# Patient Record
Sex: Female | Born: 1945 | Race: White | Hispanic: No | Marital: Married | Smoking: Never smoker
Health system: Southern US, Community
[De-identification: ages and names within clinical notes are randomized; demographics above are authoritative.]

## PROBLEM LIST (undated history)

## (undated) DIAGNOSIS — M1990 Unspecified osteoarthritis, unspecified site: Secondary | ICD-10-CM

## (undated) DIAGNOSIS — H269 Unspecified cataract: Secondary | ICD-10-CM

## (undated) DIAGNOSIS — I10 Essential (primary) hypertension: Secondary | ICD-10-CM

## (undated) HISTORY — PX: JOINT REPLACEMENT: SHX530

## (undated) HISTORY — DX: Unspecified cataract: H26.9

## (undated) HISTORY — PX: CATARACT EXTRACTION, BILATERAL: SHX1313

## (undated) HISTORY — PX: OTHER SURGICAL HISTORY: SHX169

## (undated) HISTORY — DX: Unspecified osteoarthritis, unspecified site: M19.90

## (undated) HISTORY — DX: Essential (primary) hypertension: I10

## (undated) HISTORY — PX: BREAST CYST EXCISION: SHX579

---

## 1978-04-05 HISTORY — PX: NASAL SINUS SURGERY: SHX719

## 2004-04-05 HISTORY — PX: COLONOSCOPY: SHX174

## 2006-01-18 ENCOUNTER — Ambulatory Visit: Payer: Self-pay | Admitting: Internal Medicine

## 2006-05-12 ENCOUNTER — Ambulatory Visit: Payer: Self-pay

## 2007-01-24 ENCOUNTER — Ambulatory Visit: Payer: Self-pay | Admitting: Internal Medicine

## 2008-01-17 ENCOUNTER — Ambulatory Visit: Payer: Self-pay | Admitting: Internal Medicine

## 2008-01-25 ENCOUNTER — Ambulatory Visit: Payer: Self-pay | Admitting: Internal Medicine

## 2008-04-08 ENCOUNTER — Emergency Department: Payer: Self-pay | Admitting: Emergency Medicine

## 2009-01-28 ENCOUNTER — Ambulatory Visit: Payer: Self-pay | Admitting: Internal Medicine

## 2010-01-20 ENCOUNTER — Ambulatory Visit: Payer: Self-pay | Admitting: Internal Medicine

## 2010-02-03 ENCOUNTER — Ambulatory Visit: Payer: Self-pay | Admitting: Internal Medicine

## 2011-02-11 ENCOUNTER — Ambulatory Visit: Payer: Self-pay | Admitting: Internal Medicine

## 2011-06-11 ENCOUNTER — Ambulatory Visit: Payer: Self-pay | Admitting: Internal Medicine

## 2012-04-20 ENCOUNTER — Ambulatory Visit: Payer: Self-pay | Admitting: Internal Medicine

## 2012-07-25 ENCOUNTER — Ambulatory Visit: Payer: Self-pay | Admitting: Internal Medicine

## 2012-12-02 ENCOUNTER — Emergency Department: Payer: Self-pay | Admitting: Emergency Medicine

## 2012-12-18 ENCOUNTER — Ambulatory Visit: Payer: Self-pay | Admitting: Internal Medicine

## 2013-07-17 ENCOUNTER — Ambulatory Visit: Payer: Self-pay | Admitting: Internal Medicine

## 2013-08-09 ENCOUNTER — Encounter: Payer: Self-pay | Admitting: *Deleted

## 2013-08-14 ENCOUNTER — Ambulatory Visit (INDEPENDENT_AMBULATORY_CARE_PROVIDER_SITE_OTHER): Payer: Medicare PPO | Admitting: General Surgery

## 2013-08-14 ENCOUNTER — Encounter: Payer: Self-pay | Admitting: General Surgery

## 2013-08-14 VITALS — BP 170/80 | HR 88 | Resp 14 | Ht 64.0 in | Wt 194.0 lb

## 2013-08-14 DIAGNOSIS — L723 Sebaceous cyst: Secondary | ICD-10-CM

## 2013-08-14 DIAGNOSIS — L089 Local infection of the skin and subcutaneous tissue, unspecified: Secondary | ICD-10-CM

## 2013-08-14 DIAGNOSIS — L729 Follicular cyst of the skin and subcutaneous tissue, unspecified: Principal | ICD-10-CM

## 2013-08-14 NOTE — Progress Notes (Signed)
Patient ID: Jordan Salazar, female   DOB: [birth year], 68 y.o.   MRN: [id number]  Chief Complaint  Patient presents with  . Other    breast cyst    HPI Jordan Salazar is a 68 y.o. female. here today for an evalution of a right breast cyst . In March, pt noted a firm round, small nodule on Right breast prior to mammogram in 07/15/13. Pt was given 10 days of Keflex by PCP. She was told she will have referral to surgeon if cyst became worse. PT states it was pink and has gotten progressively darker.   Patient does perform self breast check and gets regualr mammogram. .HPI  Past Medical History  Diagnosis Date  . Hypertension     Past Surgical History  Procedure Laterality Date  . Nasal sinus surgery  1980    Family History  Problem Relation Age of Onset  . Breast cancer Mother     9660  . Breast cancer Sister     1140    Social History History  Substance Use Topics  . Smoking status: Never Smoker   . Smokeless tobacco: Never Used  . Alcohol Use: Yes    No Known Allergies  Current Outpatient Prescriptions  Medication Sig Dispense Refill  . aspirin 81 MG tablet Take 81 mg by mouth daily.      . Biotin 5000 MCG CAPS Take 1 capsule by mouth daily.      . cholecalciferol (VITAMIN D) 400 UNITS TABS tablet Take 400 Units by mouth.      . Cinnamon 500 MG TABS Take 1 tablet by mouth daily.      . Garlic 1000 MG CAPS Take 1 capsule by mouth daily.      . magnesium 30 MG tablet Take 400 mg by mouth 2 (two) times daily.      . Multiple Vitamins-Minerals (MULTIVITAMIN WITH MINERALS) tablet Take 1 tablet by mouth daily.      . Omega-3 Fatty Acids (FISH OIL) 1200 MG CPDR Take 1 capsule by mouth.      . ramipril (ALTACE) 5 MG capsule Take 1 capsule by mouth daily.       No current facility-administered medications for this visit.    Review of Systems Review of Systems  Constitutional: Negative.   Respiratory: Negative.   Cardiovascular: Negative.     Blood pressure 170/80,  pulse 88, resp. rate 14, height 5\' 4"  (1.626 m), weight 194 lb (87.998 kg).  Physical Exam Physical Exam  Constitutional: She is oriented to person, place, and time. She appears well-developed and well-nourished.  Eyes: Conjunctivae are normal.  Neck: Neck supple.  Pulmonary/Chest: Right breast exhibits tenderness. Right breast exhibits no inverted nipple, no nipple discharge and no skin change. Left breast exhibits no inverted nipple, no mass, no nipple discharge, no skin change and no tenderness.  There is a 3 by 3 cm red tender mass on the superior medial aspect of the right breast. Small opening at the inferior not currently draining.   Neurological: She is alert and oriented to person, place, and time.  Skin: Skin is dry.    Data Reviewed Mammogram reviewed   Assessment    Infected skin cyst in right breast      Plan    Incision and Drainage performed with patient consent.   Area prepped with betadine 1ml 1% xylocaine instilled.  1 cm incision over central part. Pus and cyst contents was evacuated. Gauze dressing with tape. No immediate  problems with procedure.   Recheck 1 month for residual cyst.         Seeplaputhur G Sankar 08/14/2013, 2:27 PM

## 2013-08-14 NOTE — Patient Instructions (Signed)
Pt to return in 1 month.

## 2013-09-17 ENCOUNTER — Ambulatory Visit (INDEPENDENT_AMBULATORY_CARE_PROVIDER_SITE_OTHER): Payer: Medicare PPO | Admitting: General Surgery

## 2013-09-17 ENCOUNTER — Encounter: Payer: Self-pay | Admitting: General Surgery

## 2013-09-17 VITALS — BP 122/68 | HR 72 | Resp 14 | Ht 68.0 in | Wt 195.0 lb

## 2013-09-17 DIAGNOSIS — L723 Sebaceous cyst: Secondary | ICD-10-CM

## 2013-09-17 DIAGNOSIS — Z803 Family history of malignant neoplasm of breast: Secondary | ICD-10-CM

## 2013-09-17 DIAGNOSIS — L089 Local infection of the skin and subcutaneous tissue, unspecified: Secondary | ICD-10-CM

## 2013-09-17 DIAGNOSIS — L729 Follicular cyst of the skin and subcutaneous tissue, unspecified: Principal | ICD-10-CM

## 2013-09-17 NOTE — Patient Instructions (Addendum)
Patient to return as needed. Continue self breast exams. Call office for any new breast issues or concerns. Patient to consider genetic testing.

## 2013-09-17 NOTE — Progress Notes (Signed)
Patient ID: Jordan Salazar, female   DOB: [birth year], 68 y.o.   MRN: [id number]  Chief Complaint  Patient presents with  . Follow-up    1 month right breast cyst    HPI Jordan Cateraula Smith Balling is a 68 y.o. female who presents for a 1 month follow up right breast abscess that was drained. The patient denies any new problems with the breasts. She states the cyst has decreased in size since her last visit.   HPI  Past Medical History  Diagnosis Date  . Hypertension     Past Surgical History  Procedure Laterality Date  . Nasal sinus surgery  1980    Family History  Problem Relation Age of Onset  . Breast cancer Mother     6860  . Breast cancer Sister     1640    Social History History  Substance Use Topics  . Smoking status: Never Smoker   . Smokeless tobacco: Never Used  . Alcohol Use: Yes    No Known Allergies  Current Outpatient Prescriptions  Medication Sig Dispense Refill  . aspirin 81 MG tablet Take 81 mg by mouth daily.      . Biotin 5000 MCG CAPS Take 1 capsule by mouth daily.      . cholecalciferol (VITAMIN D) 400 UNITS TABS tablet Take 400 Units by mouth.      . Cinnamon 500 MG TABS Take 1 tablet by mouth daily.      . Garlic 1000 MG CAPS Take 1 capsule by mouth daily.      . magnesium 30 MG tablet Take 400 mg by mouth 2 (two) times daily.      . Multiple Vitamins-Minerals (MULTIVITAMIN WITH MINERALS) tablet Take 1 tablet by mouth daily.      . Omega-3 Fatty Acids (FISH OIL) 1200 MG CPDR Take 1 capsule by mouth.      [name]. OVER THE COUNTER MEDICATION Take 1 tablet by mouth daily. Tumeric      . ramipril (ALTACE) 5 MG capsule Take 1 capsule by mouth daily.       No current facility-administered medications for this visit.    Review of Systems Review of Systems  Constitutional: Negative.   Respiratory: Negative.   Cardiovascular: Negative.     Blood pressure 122/68, pulse 72, resp. rate 14, height 5\' 8"  (1.727 m), weight 195 lb (88.451 kg).  Physical  Exam Physical Exam  Constitutional: She is oriented to person, place, and time. She appears well-developed and well-nourished.  Pulmonary/Chest: Right breast exhibits no inverted nipple, no mass, no nipple discharge, no skin change and no tenderness.  Neurological: She is alert and oriented to person, place, and time.  Skin: Skin is warm and dry.  Drainage site medial right breast near sternal border is completely healed. No palpable residual cyst.   Data Reviewed  None  Assessment    Cutaneous cyst right breast medial. Appears to have resolved.  Patient is at high risk for breast cancer with mother and sister with breast cancer.  Genetic testing was discussed with patient. Patient to think about if she would like to pursue this.   Plan    Patient to return as needed. Patient advised to continue self breast checks monthly. She is also advised to call with any new questions or concerns.        Zhoe Catania G 09/17/2013, 8:26 PM

## 2014-02-04 ENCOUNTER — Encounter: Payer: Self-pay | Admitting: General Surgery

## 2014-02-18 ENCOUNTER — Ambulatory Visit: Payer: Self-pay | Admitting: Internal Medicine

## 2014-02-20 ENCOUNTER — Encounter: Payer: Self-pay | Admitting: *Deleted

## 2014-03-04 ENCOUNTER — Encounter: Payer: Self-pay | Admitting: General Surgery

## 2014-03-04 ENCOUNTER — Ambulatory Visit (INDEPENDENT_AMBULATORY_CARE_PROVIDER_SITE_OTHER): Payer: Medicare PPO | Admitting: General Surgery

## 2014-03-04 VITALS — BP 122/76 | HR 80 | Resp 12 | Ht 68.0 in | Wt 194.0 lb

## 2014-03-04 DIAGNOSIS — Z803 Family history of malignant neoplasm of breast: Secondary | ICD-10-CM

## 2014-03-04 NOTE — Progress Notes (Signed)
Patient ID: Colman Cateraula Smith Crotteau, female   DOB: Dec 01, 1945, 68 y.o.   MRN: [id number]  Chief Complaint  Patient presents with  . Other    colonoscopy    HPI Colman Cateraula Smith Durbin is a 68 y.o. female who presents for an evaluation of a screening colonoscopy. Her last colonoscopy was done in 2006. She denies any GI issues at this time. Reports no polyps found in previous colonoscopy. Patient's last mammogram in April  HPI  Past Medical History  Diagnosis Date  . Hypertension     Past Surgical History  Procedure Laterality Date  . Nasal sinus surgery  1980  . Colonoscopy  2006    [place] in [place]  . Uterian ablation      Family History  Problem Relation Age of Onset  . Breast cancer Mother     7060  . Breast cancer Sister     2840    Social History History  Substance Use Topics  . Smoking status: Never Smoker   . Smokeless tobacco: Never Used  . Alcohol Use: 0.0 oz/week    0 Not specified per week    No Known Allergies  Current Outpatient Prescriptions  Medication Sig Dispense Refill  . aspirin 81 MG tablet Take 81 mg by mouth daily.    . cholecalciferol (VITAMIN D) 400 UNITS TABS tablet Take 400 Units by mouth.    . Garlic 1000 MG CAPS Take 1 capsule by mouth daily.    . magnesium 30 MG tablet Take 400 mg by mouth 2 (two) times daily.    . Multiple Vitamins-Minerals (MULTIVITAMIN WITH MINERALS) tablet Take 1 tablet by mouth daily.    . Omega-3 Fatty Acids (FISH OIL) 1200 MG CPDR Take 1 capsule by mouth.    [name]. OVER THE COUNTER MEDICATION Take 1 tablet by mouth daily. Tumeric    . ramipril (ALTACE) 5 MG capsule Take 1 capsule by mouth daily.     No current facility-administered medications for this visit.    Review of Systems Review of Systems  Constitutional: Negative.   Respiratory: Negative.   Cardiovascular: Negative.   Gastrointestinal: Negative.     Blood pressure 122/76, pulse 80, resp. rate 12, height 5\' 8"  (1.727 m), weight 194 lb (87.998 kg).  Physical  Exam Physical Exam Not done today  Data Reviewed Prior notes.  Assessment    Pt will not need colonoscopy till next yr. As noted in her last note from June 2015 pt is high risk for breast Ca and qualifies for genetic testiong. She has not decided on this yet. Once again this was discussed in full with pt. She is due for her annual mammogram in March. Will arrange that and see her back. Colonoscopy can be scheduled at that visit. In the interim have urged her to consider genetic testing.    Plan    Bil screening mammogram and OV  In Mar 2016      Cc: Dr. Dewaine Oatsenny Tate, MD  Kieth BrightlySANKAR,Merlene Dante G 03/04/2014, 11:48 AM

## 2014-03-04 NOTE — Patient Instructions (Signed)
Patient to return in March 2016 with bilateral screening mammogram. Continue self breast exams. Call office for any new breast issues or concerns.

## 2014-03-07 ENCOUNTER — Encounter: Payer: Self-pay | Admitting: *Deleted

## 2014-03-07 NOTE — Progress Notes (Signed)
Pre certification forms completed for BRCA testing and faxed to [facility].

## 2014-03-12 ENCOUNTER — Telehealth: Payer: Self-pay | Admitting: *Deleted

## 2014-03-12 NOTE — Telephone Encounter (Signed)
[employer] denied BRCA they were under the impression that the testing had already been completed. I called and talked with JD to let them know testing had not been completed notes resent. Awaiting call from [facility].

## 2014-03-20 ENCOUNTER — Telehealth: Payer: Self-pay | Admitting: *Deleted

## 2014-03-20 ENCOUNTER — Encounter: Payer: Self-pay | Admitting: *Deleted

## 2014-03-20 NOTE — Telephone Encounter (Signed)
Authorization is needed for BRCA. Office notes faxed to [phone] case # [id number] for review to obtain authorization.

## 2014-03-20 NOTE — Telephone Encounter (Signed)
I talked with the patient and she is aware that we are awaiting [employer] authorization for BRCA genetic testing. It was initially denied because they thought the testing had been done, it has not. An appeal letter has been sent to them. She will contact [employer] to see if authorization is needed for them as well and let me know.

## 2014-04-03 ENCOUNTER — Ambulatory Visit (INDEPENDENT_AMBULATORY_CARE_PROVIDER_SITE_OTHER): Payer: Self-pay | Admitting: *Deleted

## 2014-04-03 DIAGNOSIS — Z803 Family history of malignant neoplasm of breast: Secondary | ICD-10-CM

## 2014-04-03 NOTE — Progress Notes (Signed)
BRCA completed, pt aware insurance pending and that she will get a call from [facility] if over $375 for her to make the final decision to proceed with testing.

## 2014-04-03 NOTE — Patient Instructions (Signed)
The patient is aware to call back for any questions or concerns.  

## 2014-04-24 ENCOUNTER — Encounter: Payer: Self-pay | Admitting: *Deleted

## 2014-04-24 NOTE — Progress Notes (Signed)
BRCA test cancelled, insurance denial.

## 2014-04-24 NOTE — Patient Instructions (Signed)
none

## 2014-05-02 ENCOUNTER — Telehealth: Payer: Self-pay | Admitting: *Deleted

## 2014-05-02 NOTE — Telephone Encounter (Signed)
Aware authorization received from [facility] and faxed to [facility]. Awaiting next step from [facility].

## 2014-07-22 ENCOUNTER — Encounter: Payer: Self-pay | Admitting: General Surgery

## 2014-07-22 ENCOUNTER — Ambulatory Visit
Admit: 2014-07-22 | Disposition: A | Discharge status: Home / Self Care | Payer: Self-pay | Attending: General Surgery | Admitting: General Surgery

## 2014-08-07 ENCOUNTER — Encounter: Payer: Self-pay | Admitting: General Surgery

## 2014-08-07 ENCOUNTER — Ambulatory Visit (INDEPENDENT_AMBULATORY_CARE_PROVIDER_SITE_OTHER): Payer: Medicare PPO | Admitting: General Surgery

## 2014-08-07 VITALS — BP 136/68 | HR 84 | Resp 18 | Ht 68.0 in | Wt 194.0 lb

## 2014-08-07 DIAGNOSIS — Z1211 Encounter for screening for malignant neoplasm of colon: Secondary | ICD-10-CM | POA: Diagnosis not present

## 2014-08-07 DIAGNOSIS — Z803 Family history of malignant neoplasm of breast: Secondary | ICD-10-CM

## 2014-08-07 MED ORDER — POLYETHYLENE GLYCOL 3350 17 GM/SCOOP PO POWD: ORAL | Status: DC

## 2014-08-07 NOTE — Patient Instructions (Addendum)
Continue self breast exams. Call office for any new breast issues or concerns.  Colonoscopy A colonoscopy is an exam to look at the entire large intestine (colon). This exam can help find problems such as tumors, polyps, inflammation, and areas of bleeding. The exam takes about 1 hour.  LET [facility] PROVIDER KNOW ABOUT:   Any allergies you have.  All medicines you are taking, including vitamins, herbs, eye drops, creams, and over-the-counter medicines.  Previous problems you or members of your family have had with the use of anesthetics.  Any blood disorders you have.  Previous surgeries you have had.  Medical conditions you have. RISKS AND COMPLICATIONS  Generally, this is a safe procedure. However, as with any procedure, complications can occur. Possible complications include:  Bleeding.  Tearing or rupture of the colon wall.  Reaction to medicines given during the exam.  Infection (rare). BEFORE THE PROCEDURE   Ask your health care provider about changing or stopping your regular medicines.  You may be prescribed an oral bowel prep. This involves drinking a large amount of medicated liquid, starting the day before your procedure. The liquid will cause you to have multiple loose stools until your stool is almost clear or light green. This cleans out your colon in preparation for the procedure.  Do not eat or drink anything else once you have started the bowel prep, unless your health care provider tells you it is safe to do so.  Arrange for someone to drive you home after the procedure. PROCEDURE   You will be given medicine to help you relax (sedative).  You will lie on your side with your knees bent.  A long, flexible tube with a light and camera on the end (colonoscope) will be inserted through the rectum and into the colon. The camera sends video back to a computer screen as it moves through the colon. The colonoscope also releases carbon dioxide gas to inflate  the colon. This helps your health care provider see the area better.  During the exam, your health care provider may take a small tissue sample (biopsy) to be examined under a microscope if any abnormalities are found.  The exam is finished when the entire colon has been viewed. AFTER THE PROCEDURE   Do not drive for 24 hours after the exam.  You may have a small amount of blood in your stool.  You may pass moderate amounts of gas and have mild abdominal cramping or bloating. This is caused by the gas used to inflate your colon during the exam.  Ask when your test results will be ready and how you will get your results. Make sure you get your test results. Document Released: 03/19/2000 Document Revised: 01/10/2013 Document Reviewed: 11/27/2012 [facility] [employer] Patient Information 2015 [employer] [place]. This information is not intended to replace advice given to you by your health care provider. Make sure you discuss any questions you have with your health care provider.  Patient has been scheduled [facility]. This patient has been asked to discontinue fish oil one week prior to procedure.

## 2014-08-07 NOTE — Progress Notes (Signed)
Patient ID: Jordan Salazar, female   DOB: 06/26/1945, 68 y.o.   MRN: [id number]  Chief Complaint  Patient presents with  . Follow-up    mammogram    HPI Jordan Salazar is a 68 y.o. female.  who presents for her follow up mammogram and breast evaluation. The most recent mammogram was done on 07-22-14.  Patient does perform regular self breast checks and gets regular mammograms done.  No new breast issues.  HPI  Past Medical History  Diagnosis Date  . Hypertension     Past Surgical History  Procedure Laterality Date  . Nasal sinus surgery  1980  . Colonoscopy  2006    Done in [place]  . Uterian ablation      Family History  Problem Relation Age of Onset  . Breast cancer Mother     60  . Breast cancer Sister     40    Social History History  Substance Use Topics  . Smoking status: Never Smoker   . Smokeless tobacco: Never Used  . Alcohol Use: 0.0 oz/week    0 Standard drinks or equivalent per week    No Known Allergies  Current Outpatient Prescriptions  Medication Sig Dispense Refill  . aspirin 81 MG tablet Take 81 mg by mouth daily.    . cholecalciferol (VITAMIN D) 400 UNITS TABS tablet Take 400 Units by mouth.    . Garlic 1000 MG CAPS Take 1 capsule by mouth daily.    . magnesium 30 MG tablet Take 400 mg by mouth 2 (two) times daily.    . Multiple Vitamins-Minerals (MULTIVITAMIN WITH MINERALS) tablet Take 1 tablet by mouth daily.    . Omega-3 Fatty Acids (FISH OIL) 1200 MG CPDR Take 1 capsule by mouth.    . OVER THE COUNTER MEDICATION Take 1 tablet by mouth daily. Tumeric    . ramipril (ALTACE) 5 MG capsule Take 1 capsule by mouth daily.     No current facility-administered medications for this visit.    Review of Systems Review of Systems  Constitutional: Negative.   Respiratory: Negative.   Cardiovascular: Negative.     Blood pressure 136/68, pulse 84, resp. rate 18, height 5' 8" (1.727 m), weight 194 lb (87.998 kg).  Physical Exam Physical  Exam  Constitutional: She is oriented to person, place, and time. She appears well-developed and well-nourished.  Eyes: Conjunctivae are normal. No scleral icterus.  Neck: Neck supple.  Cardiovascular: Normal rate, regular rhythm and normal heart sounds.   Pulmonary/Chest: Effort normal and breath sounds normal. Right breast exhibits no inverted nipple, no mass, no nipple discharge, no skin change and no tenderness. Left breast exhibits no inverted nipple, no mass, no nipple discharge, no skin change and no tenderness.  Abdominal: Soft. Normal appearance. There is no tenderness. A hernia is present.  Small umbilical hernia.  Lymphadenopathy:    She has no cervical adenopathy.    She has no axillary adenopathy.  Neurological: She is alert and oriented to person, place, and time.  Skin: Skin is warm and dry.    Data Reviewed Mammogram reviewed and stable.  Assessment    Stable physical exam. FH of colon cancer.     Plan    The patient has been asked to return to the office in one year with a bilateral screening mammogram. Continue self breast exams. Call office for any new breast issues or concerns.  Pt is due for screening colonoscopy. Colonoscopy with possible biopsy/polypectomy prn: Information regarding   the procedure, including its potential risks and complications (including but not limited to perforation of the bowel, which may require emergency surgery to repair, and bleeding) was verbally given to the patient. Educational information regarding lower instestinal endoscopy was given to the patient. Written instructions for how to complete the bowel prep using Miralax were provided. The importance of drinking ample fluids to avoid dehydration as a result of the prep emphasized.       PCP:  Jaquita Rectorate, Denny C  Jordayn Mink G 08/07/2014, 9:32 AM

## 2014-08-14 ENCOUNTER — Ambulatory Visit
Admission: RE | Admit: 2014-08-14 | Discharge: 2014-08-14 | Disposition: A | Discharge status: Home / Self Care | Payer: Medicare PPO | Source: Ambulatory Visit | Attending: General Surgery | Admitting: General Surgery

## 2014-08-14 ENCOUNTER — Ambulatory Visit: Payer: Medicare PPO | Admitting: Anesthesiology

## 2014-08-14 ENCOUNTER — Encounter
Admission: RE | Disposition: A | Discharge status: Home / Self Care | Payer: Self-pay | Source: Ambulatory Visit | Attending: General Surgery

## 2014-08-14 ENCOUNTER — Encounter: Payer: Self-pay | Admitting: General Surgery

## 2014-08-14 DIAGNOSIS — I10 Essential (primary) hypertension: Secondary | ICD-10-CM | POA: Diagnosis not present

## 2014-08-14 DIAGNOSIS — Z7982 Long term (current) use of aspirin: Secondary | ICD-10-CM | POA: Insufficient documentation

## 2014-08-14 DIAGNOSIS — Z79899 Other long term (current) drug therapy: Secondary | ICD-10-CM | POA: Diagnosis not present

## 2014-08-14 DIAGNOSIS — Z1211 Encounter for screening for malignant neoplasm of colon: Secondary | ICD-10-CM | POA: Diagnosis present

## 2014-08-14 DIAGNOSIS — Z9889 Other specified postprocedural states: Secondary | ICD-10-CM | POA: Insufficient documentation

## 2014-08-14 DIAGNOSIS — Z800 Family history of malignant neoplasm of digestive organs: Secondary | ICD-10-CM | POA: Insufficient documentation

## 2014-08-14 DIAGNOSIS — Z803 Family history of malignant neoplasm of breast: Secondary | ICD-10-CM | POA: Diagnosis not present

## 2014-08-14 HISTORY — PX: COLONOSCOPY: SHX5424

## 2014-08-14 SURGERY — COLONOSCOPY
Anesthesia: General

## 2014-08-14 MED ORDER — LACTATED RINGERS IV SOLN
INTRAVENOUS | Status: DC
  Administered 2014-08-14: 1000 mL via INTRAVENOUS

## 2014-08-14 MED ORDER — LIDOCAINE HCL (CARDIAC) 20 MG/ML IV SOLN
INTRAVENOUS | Status: DC | PRN
  Administered 2014-08-14: 60 mg via INTRAVENOUS

## 2014-08-14 MED ORDER — PROPOFOL 10 MG/ML IV BOLUS
INTRAVENOUS | Status: DC | PRN
  Administered 2014-08-14: 50 mg via INTRAVENOUS

## 2014-08-14 MED ORDER — PROPOFOL INFUSION 10 MG/ML OPTIME
INTRAVENOUS | Status: DC | PRN
  Administered 2014-08-14: 250 ug/kg/min via INTRAVENOUS

## 2014-08-14 NOTE — Anesthesia Postprocedure Evaluation (Signed)
  Anesthesia Post-op Note  Patient: Jordan Salazar  Procedure(s) Performed: Procedure(s): COLONOSCOPY (N/A)  Anesthesia type:General  Patient location: PACU  Post pain: Pain level controlled  Post assessment: Post-op Vital signs reviewed, Patient's Cardiovascular Status Stable, Respiratory Function Stable, Patent Airway and No signs of Nausea or vomiting  Post vital signs: Reviewed and stable  Last Vitals:  Filed Vitals:   08/14/14 1059  BP: 104/59  Pulse: 62  Temp: 36 C  Resp: 13    Level of consciousness: awake, alert  and patient cooperative  Complications: No apparent anesthesia complications

## 2014-08-14 NOTE — Op Note (Signed)
[facility]astroenter[facility] Patient Name: Jordan Salazar Procedure Date: 08/14/2014 10:28 AM MRN: [id number] Account #: [id number] Date of Birth: 17-Apr-1945 Admit Type: Outpatient Age: [age] Room[facility] ENDO ROOM 1 Gender: Female Note Status: Finalized Procedure:         Colonoscopy Indications:       Family history of colon cancer in a first-degree relative Providers:         Clovia CuffSeeplaputhr G. Sankar, MD Referring MD:      Jillene Bucksenny C. Arlana Pouchate, MD (Referring MD) Medicines:         General Anesthesia Complications:     No immediate complications. Procedure:         Pre-Anesthesia Assessment:                    - General anesthesia under the supervision of an                     anesthesiologist was determined to be medically necessary                     for this procedure based on review of the patient's                     medical history, medications, and prior anesthesia history.                    After obtaining informed consent, the colonoscope was                     passed under direct vision. Throughout the procedure, the                     patient's blood pressure, pulse, and oxygen saturations                     were monitored continuously. The Colonoscope was                     introduced through the anus and advanced to the the cecum,                     identified by the ileocecal valve. The colonoscopy was                     performed with ease. The patient tolerated the procedure                     well. The quality of the bowel preparation was good. Findings:      The perianal and digital rectal examinations were normal.      The entire examined colon appeared normal on direct and retroflexion       views. Impression:        - The entire examined colon is normal on direct and                     retroflexion views.                    - No specimens collected. Recommendation:    - Repeat colonoscopy in 5 years for surveillance. Procedure Code(s): ---  Professional ---                    [id number], Colonoscopy, flexible; diagnostic, including  collection of specimen(s) by brushing or washing, when                     performed (separate procedure) Diagnosis Code(s): --- Professional ---                    Z80.0, Family history of malignant neoplasm of digestive                     organs CPT copyright 2014 American Medical Association. All rights reserved. The codes documented in this report are preliminary and upon coder review may  be revised to meet current compliance requirements. Clovia CuffSeeplaputhr G Sankar, MD 08/14/2014 10:53:16 AM This report has been signed electronically. Number of Addenda: 0 Note Initiated On: 08/14/2014 10:28 AM Scope Withdrawal Time: 0 hours 4 minutes 7 seconds  Total Procedure Duration: 0 hours 15 minutes 33 seconds       [facility]

## 2014-08-14 NOTE — Interval H&P Note (Signed)
History and Physical Interval Note:  08/14/2014 9:12 AM  Jordan Salazar  has presented today for surgery, with the diagnosis of SCREENING  The various methods of treatment have been discussed with the patient and family. After consideration of risks, benefits and other options for treatment, the patient has consented to  Procedure(s): COLONOSCOPY (N/A) as a surveillance procedure .  The patient's history has been reviewed, patient examined, no change in status, stable for surgery.  I have reviewed the patient's chart and labs.  Questions were answered to the patient's satisfaction.     SANKAR,SEEPLAPUTHUR G

## 2014-08-14 NOTE — Transfer of Care (Signed)
Immediate Anesthesia Transfer of Care Note  Patient: Jordan Salazar  Procedure(s) Performed: Procedure(s): COLONOSCOPY (N/A)  Patient Location: PACU  Anesthesia Type:General  Level of Consciousness: sedated  Airway & Oxygen Therapy: Patient Spontanous Breathing and Patient connected to nasal cannula oxygen  Post-op Assessment: Report given to RN and Post -op Vital signs reviewed and stable  Post vital signs: stable  Last Vitals:  Filed Vitals:   08/14/14 1059  BP: 104/59  Pulse: 62  Temp: 36 C  Resp: 13    Complications: No apparent anesthesia complications

## 2014-08-14 NOTE — Anesthesia Preprocedure Evaluation (Signed)
Anesthesia Evaluation  Patient identified by MRN, date of birth, ID band Patient awake    Airway Mallampati: II  TM Distance: >3 FB Neck ROM: Full    Dental  (+)    Pulmonary          Cardiovascular hypertension, Pt. on medications     Neuro/Psych    GI/Hepatic   Endo/Other    Renal/GU      Musculoskeletal   Abdominal   Peds  Hematology   Anesthesia Other Findings   Reproductive/Obstetrics                             Anesthesia Physical Anesthesia Plan  ASA: II  Anesthesia Plan: General   Post-op Pain Management:    Induction:   Airway Management Planned:   Additional Equipment:   Intra-op Plan:   Post-operative Plan:   Informed Consent: I have reviewed the patients History and Physical, chart, labs and discussed the procedure including the risks, benefits and alternatives for the proposed anesthesia with the patient or authorized representative who has indicated his/her understanding and acceptance.     Plan Discussed with: CRNA  Anesthesia Plan Comments:         Anesthesia Quick Evaluation

## 2014-08-14 NOTE — H&P (View-Only) (Signed)
Patient ID: Jordan Salazar, female   DOB: 07-28-1945, 69 y.o.   MRN: [id number]  Chief Complaint  Patient presents with  . Follow-up    mammogram    HPI Jordan Salazar is a 69 y.o. female.  who presents for her follow up mammogram and breast evaluation. The most recent mammogram was done on 07-22-14.  Patient does perform regular self breast checks and gets regular mammograms done.  No new breast issues.  HPI  Past Medical History  Diagnosis Date  . Hypertension     Past Surgical History  Procedure Laterality Date  . Nasal sinus surgery  1980  . Colonoscopy  2006    [place] in [place]  . Uterian ablation      Family History  Problem Relation Age of Onset  . Breast cancer Mother     2960  . Breast cancer Sister     4640    Social History History  Substance Use Topics  . Smoking status: Never Smoker   . Smokeless tobacco: Never Used  . Alcohol Use: 0.0 oz/week    0 Standard drinks or equivalent per week    No Known Allergies  Current Outpatient Prescriptions  Medication Sig Dispense Refill  . aspirin 81 MG tablet Take 81 mg by mouth daily.    . cholecalciferol (VITAMIN D) 400 UNITS TABS tablet Take 400 Units by mouth.    . Garlic 1000 MG CAPS Take 1 capsule by mouth daily.    . magnesium 30 MG tablet Take 400 mg by mouth 2 (two) times daily.    . Multiple Vitamins-Minerals (MULTIVITAMIN WITH MINERALS) tablet Take 1 tablet by mouth daily.    . Omega-3 Fatty Acids (FISH OIL) 1200 MG CPDR Take 1 capsule by mouth.    [name]. OVER THE COUNTER MEDICATION Take 1 tablet by mouth daily. Tumeric    . ramipril (ALTACE) 5 MG capsule Take 1 capsule by mouth daily.     No current facility-administered medications for this visit.    Review of Systems Review of Systems  Constitutional: Negative.   Respiratory: Negative.   Cardiovascular: Negative.     Blood pressure 136/68, pulse 84, resp. rate 18, height 5\' 8"  (1.727 m), weight 194 lb (87.998 kg).  Physical Exam Physical  Exam  Constitutional: She is oriented to person, place, and time. She appears well-developed and well-nourished.  Eyes: Conjunctivae are normal. No scleral icterus.  Neck: Neck supple.  Cardiovascular: Normal rate, regular rhythm and normal heart sounds.   Pulmonary/Chest: Effort normal and breath sounds normal. Right breast exhibits no inverted nipple, no mass, no nipple discharge, no skin change and no tenderness. Left breast exhibits no inverted nipple, no mass, no nipple discharge, no skin change and no tenderness.  Abdominal: Soft. Normal appearance. There is no tenderness. A hernia is present.  Small umbilical hernia.  Lymphadenopathy:    She has no cervical adenopathy.    She has no axillary adenopathy.  Neurological: She is alert and oriented to person, place, and time.  Skin: Skin is warm and dry.    Data Reviewed Mammogram reviewed and stable.  Assessment    Stable physical exam. FH of colon cancer.     Plan    The patient has been asked to return to the office in one year with a bilateral screening mammogram. Continue self breast exams. Call office for any new breast issues or concerns.  Pt is due for screening colonoscopy. Colonoscopy with possible biopsy/polypectomy prn: Information regarding  the procedure, including its potential risks and complications (including but not limited to perforation of the bowel, which may require emergency surgery to repair, and bleeding) was verbally given to the patient. Educational information regarding lower instestinal endoscopy was given to the patient. Written instructions for how to complete the bowel prep using Miralax were provided. The importance of drinking ample fluids to avoid dehydration as a result of the prep emphasized.       PCP:  Jaquita Rectorate, Denny C  Avaneesh Pepitone G 08/07/2014, 9:32 AM

## 2015-01-01 DIAGNOSIS — H02411 Mechanical ptosis of right eyelid: Secondary | ICD-10-CM | POA: Insufficient documentation

## 2015-02-24 ENCOUNTER — Other Ambulatory Visit: Payer: Self-pay | Admitting: Internal Medicine

## 2015-02-24 DIAGNOSIS — Z1231 Encounter for screening mammogram for malignant neoplasm of breast: Secondary | ICD-10-CM

## 2015-05-29 ENCOUNTER — Other Ambulatory Visit: Payer: Self-pay | Admitting: *Deleted

## 2015-05-29 DIAGNOSIS — Z1231 Encounter for screening mammogram for malignant neoplasm of breast: Secondary | ICD-10-CM

## 2015-06-17 ENCOUNTER — Encounter: Payer: Self-pay | Admitting: *Deleted

## 2015-07-23 ENCOUNTER — Ambulatory Visit
Admission: RE | Admit: 2015-07-23 | Discharge: 2015-07-23 | Disposition: A | Discharge status: Home / Self Care | Payer: Medicare PPO | Source: Ambulatory Visit | Attending: Internal Medicine | Admitting: Internal Medicine

## 2015-07-23 ENCOUNTER — Other Ambulatory Visit: Payer: Self-pay | Admitting: Internal Medicine

## 2015-07-23 DIAGNOSIS — Z1231 Encounter for screening mammogram for malignant neoplasm of breast: Secondary | ICD-10-CM | POA: Diagnosis present

## 2015-07-29 ENCOUNTER — Encounter: Payer: Self-pay | Admitting: General Surgery

## 2015-07-29 ENCOUNTER — Ambulatory Visit (INDEPENDENT_AMBULATORY_CARE_PROVIDER_SITE_OTHER): Payer: Medicare PPO | Admitting: General Surgery

## 2015-07-29 VITALS — BP 144/82 | HR 72 | Resp 16 | Ht 68.0 in | Wt 194.0 lb

## 2015-07-29 DIAGNOSIS — L729 Follicular cyst of the skin and subcutaneous tissue, unspecified: Secondary | ICD-10-CM

## 2015-07-29 DIAGNOSIS — Z803 Family history of malignant neoplasm of breast: Secondary | ICD-10-CM | POA: Diagnosis not present

## 2015-07-29 NOTE — Patient Instructions (Addendum)
The patient has been asked to return to the office in one year with a bilateral screening mammogram. Patient to return be scheduled for two cyst removal chest wall.

## 2015-07-29 NOTE — Progress Notes (Signed)
Patient ID: Jordan Cateraula Smith Durnil, female   DOB: April 25, 1945, 70 y.o.   MRN: [id number]  Chief Complaint  Patient presents with  . Follow-up    mammogram    HPI Jordan Salazar is a 70 y.o. female who presents for a breast evaluation. The most recent mammogram was done on 07/23/15.  Patient does perform regular self breast checks and gets regular mammograms done.  No new breast issues. I have reviewed the history of present illness with the patient.  HPI  Past Medical History  Diagnosis Date  . Hypertension     Past Surgical History  Procedure Laterality Date  . Nasal sinus surgery  1980  . Colonoscopy  2006    Done in [place]  . Uterian ablation    . Colonoscopy N/A 08/14/2014    Procedure: COLONOSCOPY;  Surgeon: Kieth BrightlySeeplaputhur G Sankar, MD;  Location: [facility] ENDOSCOPY;  Service: Endoscopy;  Laterality: N/A;    Family History  Problem Relation Age of Onset  . Breast cancer Mother     7160  . Breast cancer Sister     8940    Social History Social History  Substance Use Topics  . Smoking status: Never Smoker   . Smokeless tobacco: Never Used  . Alcohol Use: 0.0 oz/week    0 Standard drinks or equivalent per week    No Known Allergies  Current Outpatient Prescriptions  Medication Sig Dispense Refill  . aspirin 81 MG tablet Take 81 mg by mouth daily.    . cholecalciferol (VITAMIN D) 400 UNITS TABS tablet Take 400 Units by mouth daily.     . Garlic 1000 MG CAPS Take 1 capsule by mouth daily.    . Magnesium 400 MG CAPS Take 1 capsule by mouth daily.    . Multiple Vitamins-Minerals (MULTIVITAMIN WITH MINERALS) tablet Take 1 tablet by mouth daily.    . Omega-3 Fatty Acids (FISH OIL) 1200 MG CPDR Take 2 capsules by mouth daily.     . ramipril (ALTACE) 5 MG capsule Take 1 capsule by mouth daily. Reported on 07/29/2015    . Zinc 25 MG TABS Take by mouth.     No current facility-administered medications for this visit.    Review of Systems Review of Systems  Respiratory:  Negative.   Cardiovascular: Negative.     Blood pressure 144/82, pulse 72, resp. rate 16, height 5\' 8"  (1.727 m), weight 194 lb (87.998 kg).  Physical Exam Physical Exam  Constitutional: She is oriented to person, place, and time. She appears well-developed and well-nourished.  Eyes: Conjunctivae are normal. No scleral icterus.  Neck: Neck supple.  Cardiovascular: Normal rate, regular rhythm and normal heart sounds.   Pulmonary/Chest: Effort normal and breath sounds normal. Right breast exhibits no inverted nipple, no mass, no nipple discharge, no skin change and no tenderness. Left breast exhibits no inverted nipple, no mass, no nipple discharge, no skin change and no tenderness.    Abdominal: Soft. Normal appearance and bowel sounds are normal. There is no hepatomegaly. There is no tenderness. A hernia is present.  Lymphadenopathy:    She has no cervical adenopathy.    She has no axillary adenopathy.  Neurological: She is alert and oriented to person, place, and time.  Skin: Skin is warm and dry.    Data Reviewed Mammogram reviewed and stable.  Assessment       Stable physical exam. FH of breast cancer. Skin cysts medial edge of right breast with prior infection. Given the location it  is best to have these removed at a time convenant to her. Discussed with her and she is agreeable. Also discussed genetic testing, patient declines at this time. Plan    Patient to return be scheduled for two cyst removal chest wall.    The patient has been asked to return to the office in one year with a bilateral screening mammogram. Continue self breast exams. Call office for any new breast issues or concerns.  PCP:  Dewaine Oats C This information has been scribed by Ples Specter CMA.  SANKAR,SEEPLAPUTHUR G 07/29/2015, 10:45 AM

## 2015-08-14 ENCOUNTER — Ambulatory Visit (INDEPENDENT_AMBULATORY_CARE_PROVIDER_SITE_OTHER): Payer: Medicare PPO | Admitting: General Surgery

## 2015-08-14 ENCOUNTER — Encounter: Payer: Self-pay | Admitting: General Surgery

## 2015-08-14 VITALS — BP 122/66 | HR 80 | Resp 12 | Ht 68.0 in | Wt 192.0 lb

## 2015-08-14 DIAGNOSIS — L729 Follicular cyst of the skin and subcutaneous tissue, unspecified: Secondary | ICD-10-CM | POA: Diagnosis not present

## 2015-08-14 NOTE — Progress Notes (Signed)
Here today for excision of chest wall cysts(2), sternal area.  Procedure: excision 2 small skin cysts sternal area  Anesthetic: 10ml 0.5%marciane mixed with 1% xylocaine  Prep Chloro Prep  Description: After local anesthetic instilled area was prepped and draped . The 1cm and 6mm cysts were 1.5 cm apart. Transverse elliptical incisons made over the the two cyst. Both cysts were completely excised. Sent to pathology. Bleeding controlled with cautery. Skin closed with interrupted 4-0 Nylon sutures. Neosporin oint , telfa and tegaderm placed.   No immediate problems from procedure.  Suture removal in 10 days.      Follow up in 10 days for suture removal.  PCP:  Dewaine Oatsate, Denny C This information has been scribed by Dorathy DaftMarsha Hatch RN, BSN,BC.

## 2015-08-14 NOTE — Patient Instructions (Addendum)
The patient is aware to call back for any questions or concerns. Keep area clean. The dressing we applied today is waterproof, you can shower May change dressing and use band aid with neosporin as needed

## 2015-08-19 ENCOUNTER — Telehealth: Payer: Self-pay | Admitting: *Deleted

## 2015-08-19 NOTE — Telephone Encounter (Signed)
-----   Message from Kieth BrightlySeeplaputhur G Sankar, MD sent at 08/19/2015  9:57 AM EDT ----- Please let pt pt know the pathology was normal.

## 2015-08-19 NOTE — Telephone Encounter (Signed)
Notified patient as instructed, patient pleased. Discussed follow-up appointments, patient agrees  

## 2015-08-25 ENCOUNTER — Ambulatory Visit (INDEPENDENT_AMBULATORY_CARE_PROVIDER_SITE_OTHER): Payer: Medicare PPO

## 2015-08-25 DIAGNOSIS — L729 Follicular cyst of the skin and subcutaneous tissue, unspecified: Secondary | ICD-10-CM

## 2015-08-25 NOTE — Progress Notes (Signed)
Patient ID: Jordan Salazar, female   DOB: 1945/10/18, 70 y.o.   MRN: [id number] Patient came in today for a wound check.  The wound is clean, with no signs of infection noted. The sutures were removed and steri strips applied.

## 2016-02-25 ENCOUNTER — Other Ambulatory Visit: Payer: Self-pay | Admitting: Internal Medicine

## 2016-02-25 DIAGNOSIS — Z780 Asymptomatic menopausal state: Secondary | ICD-10-CM

## 2016-04-05 HISTORY — PX: BLADDER SURGERY: SHX569

## 2016-04-05 HISTORY — PX: ABDOMINAL HYSTERECTOMY: SHX81

## 2016-04-08 ENCOUNTER — Ambulatory Visit: Payer: Medicare PPO

## 2016-05-13 ENCOUNTER — Ambulatory Visit: Payer: Medicare PPO

## 2016-05-17 ENCOUNTER — Ambulatory Visit
Admission: RE | Admit: 2016-05-17 | Discharge: 2016-05-17 | Disposition: A | Discharge status: Home / Self Care | Payer: Medicare PPO | Source: Ambulatory Visit | Attending: Internal Medicine | Admitting: Internal Medicine

## 2016-05-17 DIAGNOSIS — Z780 Asymptomatic menopausal state: Secondary | ICD-10-CM | POA: Diagnosis not present

## 2016-05-24 ENCOUNTER — Other Ambulatory Visit: Payer: Self-pay

## 2016-05-24 DIAGNOSIS — Z1231 Encounter for screening mammogram for malignant neoplasm of breast: Secondary | ICD-10-CM

## 2016-07-26 ENCOUNTER — Ambulatory Visit
Admission: RE | Admit: 2016-07-26 | Discharge: 2016-07-26 | Disposition: A | Discharge status: Home / Self Care | Payer: Medicare PPO | Source: Ambulatory Visit | Attending: General Surgery | Admitting: General Surgery

## 2016-07-26 DIAGNOSIS — Z1231 Encounter for screening mammogram for malignant neoplasm of breast: Secondary | ICD-10-CM | POA: Diagnosis present

## 2016-08-03 DIAGNOSIS — N819 Female genital prolapse, unspecified: Secondary | ICD-10-CM | POA: Insufficient documentation

## 2016-08-04 ENCOUNTER — Encounter: Payer: Self-pay | Admitting: General Surgery

## 2016-08-04 ENCOUNTER — Ambulatory Visit (INDEPENDENT_AMBULATORY_CARE_PROVIDER_SITE_OTHER): Payer: Medicare PPO | Admitting: General Surgery

## 2016-08-04 VITALS — BP 112/70 | HR 80 | Resp 12 | Ht 68.0 in | Wt 196.0 lb

## 2016-08-04 DIAGNOSIS — Z803 Family history of malignant neoplasm of breast: Secondary | ICD-10-CM

## 2016-08-04 NOTE — Patient Instructions (Addendum)
Patient instructed to return in 1 year for bilateral mammogram screening and breast exam with Dr. Lemar Livings. Colonoscopy due 2026

## 2016-08-04 NOTE — Progress Notes (Signed)
Patient ID: Jordan Salazar, female   DOB: 07-Sep-1945, 71 y.o.   MRN: [id number]  Chief Complaint  Patient presents with  . Follow-up    mammogram    HPI Jordan Salazar is a 71 y.o. female who presents for a breast evaluation. The most recent mammogram was done on 07-29-16 .  Patient does perform regular self breast checks and gets regular mammograms done.    No new breast issues.  HPI  Past Medical History:  Diagnosis Date  . Hypertension     Past Surgical History:  Procedure Laterality Date  . BREAST CYST EXCISION Right    sebaceous cyst removed  . COLONOSCOPY  2006   Done in [place]  . COLONOSCOPY N/A 08/14/2014   Procedure: COLONOSCOPY;  Surgeon: Christene Lye, MD;  Location: [facility] ENDOSCOPY;  Service: Endoscopy;  Laterality: N/A;  . NASAL SINUS SURGERY  1980  . uterian ablation      Family History  Problem Relation Age of Onset  . Breast cancer Mother     49  . Breast cancer Sister     53    Social History Social History  Substance Use Topics  . Smoking status: Never Smoker  . Smokeless tobacco: Never Used  . Alcohol use 0.0 oz/week    No Known Allergies  Current Outpatient Prescriptions  Medication Sig Dispense Refill  . aspirin 81 MG tablet Take 81 mg by mouth daily.    . cholecalciferol (VITAMIN D) 400 UNITS TABS tablet Take 400 Units by mouth daily.     . Coenzyme Q10 (CO Q10) 100 MG CAPS Take 200 mg by mouth daily.    [name] gabapentin (NEURONTIN) 300 MG capsule TAKE ONE CAPSULE BY MOUTH EVERY DAY AT BEDTIME    . Garlic 9179 MG CAPS Take 1 capsule by mouth daily.    . Magnesium 400 MG CAPS Take 1 capsule by mouth daily.    . Multiple Vitamins-Minerals (MULTIVITAMIN WITH MINERALS) tablet Take 1 tablet by mouth daily.    . Omega-3 Fatty Acids (FISH OIL) 1200 MG CPDR Take 2 capsules by mouth daily.     . ramipril (ALTACE) 5 MG capsule Take 1 capsule by mouth daily. Reported on 07/29/2015     No current facility-administered medications for this  visit.     Review of Systems Review of Systems  Constitutional: Negative.   Respiratory: Negative.   Cardiovascular: Negative.     Blood pressure 112/70, pulse 80, resp. rate 12, height 5' 8"  (1.727 m), weight 196 lb (88.9 kg).  Physical Exam Physical Exam  Constitutional: She is oriented to person, place, and time. She appears well-developed and well-nourished.  HENT:  Mouth/Throat: No oropharyngeal exudate.  Eyes: Conjunctivae are normal. No scleral icterus.  Neck: Neck supple.  Cardiovascular: Normal rate, regular rhythm and normal heart sounds.   Pulmonary/Chest: Effort normal and breath sounds normal. Right breast exhibits no inverted nipple, no mass, no nipple discharge, no skin change and no tenderness. Left breast exhibits no inverted nipple, no mass, no nipple discharge, no skin change and no tenderness.  Abdominal: Soft. Bowel sounds are normal. A hernia is present.    Lymphadenopathy:    She has no cervical adenopathy.    She has no axillary adenopathy.  Neurological: She is alert and oriented to person, place, and time.  Skin: Skin is warm and dry.  Psychiatric: Her behavior is normal.    Data Reviewed Mammogram reviewed - stable Prior notes reviewed.  Assessment  Family history breast cancer.. Small umbilical hernia observed on PE. Asymptomatic.     Plan    Patient instructed to return in 1 year with bilateral mammogram screening and breast exam with Dr. Bary Castilla.   Insurance denied BRCA testing Colonoscopy due 2026  HPI, Physical Exam, Assessment and Plan have been scribed under the direction and in the presence of Mckinley Jewel, MD  Karie Fetch, RN  I have completed the exam and reviewed the above documentation for accuracy and completeness.  I agree with the above.  Haematologist has been used and any errors in dictation or transcription are unintentional.  Seeplaputhur G. Jamal Collin, M.D., F.A.C.S.  Junie Panning G 08/04/2016, 9:21  AM

## 2016-12-23 ENCOUNTER — Other Ambulatory Visit: Payer: Self-pay | Admitting: Orthopedic Surgery

## 2016-12-23 DIAGNOSIS — M1711 Unilateral primary osteoarthritis, right knee: Secondary | ICD-10-CM

## 2016-12-23 DIAGNOSIS — M2391 Unspecified internal derangement of right knee: Secondary | ICD-10-CM

## 2016-12-23 DIAGNOSIS — M25561 Pain in right knee: Secondary | ICD-10-CM

## 2016-12-29 ENCOUNTER — Telehealth: Payer: Self-pay | Admitting: Orthopedic Surgery

## 2016-12-30 ENCOUNTER — Ambulatory Visit
Admission: RE | Admit: 2016-12-30 | Discharge: 2016-12-30 | Disposition: A | Discharge status: Home / Self Care | Payer: Medicare PPO | Source: Ambulatory Visit | Attending: Orthopedic Surgery | Admitting: Orthopedic Surgery

## 2017-01-18 ENCOUNTER — Ambulatory Visit: Payer: Medicare PPO

## 2017-04-25 ENCOUNTER — Encounter (INDEPENDENT_AMBULATORY_CARE_PROVIDER_SITE_OTHER): Payer: Self-pay | Admitting: Vascular Surgery

## 2017-04-25 ENCOUNTER — Ambulatory Visit (INDEPENDENT_AMBULATORY_CARE_PROVIDER_SITE_OTHER): Payer: Medicare PPO | Admitting: Vascular Surgery

## 2017-04-25 DIAGNOSIS — M1990 Unspecified osteoarthritis, unspecified site: Secondary | ICD-10-CM | POA: Insufficient documentation

## 2017-04-25 DIAGNOSIS — M150 Primary generalized (osteo)arthritis: Secondary | ICD-10-CM

## 2017-04-25 DIAGNOSIS — I83813 Varicose veins of bilateral lower extremities with pain: Secondary | ICD-10-CM | POA: Diagnosis not present

## 2017-04-25 DIAGNOSIS — I872 Venous insufficiency (chronic) (peripheral): Secondary | ICD-10-CM

## 2017-04-25 DIAGNOSIS — M159 Polyosteoarthritis, unspecified: Secondary | ICD-10-CM

## 2017-04-25 NOTE — Progress Notes (Signed)
MRN : [id number]  Jordan Salazar is a 72 y.o. (28-Oct-1945) female who presents with chief complaint of  Chief Complaint  Patient presents with  . New Patient (Initial Visit)    ref Arlana Pouch bil calf pain  .  History of Present Illness: The patient is seen for evaluation of symptomatic varicose veins. The patient relates burning and stinging which worsened steadily throughout the course of the day, particularly with standing. The patient also notes an aching and throbbing pain over the varicosities, particularly with prolonged dependent positions. The symptoms are significantly improved with elevation.  The patient also notes that during hot weather the symptoms are greatly intensified. The patient states the pain from the varicose veins interferes with work, daily exercise, shopping and household maintenance. At this point, the symptoms are persistent and severe enough that they're having a negative impact on lifestyle and are interfering with daily activities.  There is no history of DVT, PE or superficial thrombophlebitis. There is no history of ulceration or hemorrhage. The patient denies a significant family history of varicose veins.  The patient has worn graduated compression for several years and finds then only moderately helpful. At the present time the patient has not been using over-the-counter analgesics. There is a history of prior sclerotherapy.   Current Meds  Medication Sig  . aspirin 81 MG tablet Take 81 mg by mouth daily.  . Biotin 40981 MCG TABS Take by mouth daily.  . cholecalciferol (VITAMIN D) 400 UNITS TABS tablet Take 400 Units by mouth daily.   . Coenzyme Q10 (CO Q10) 100 MG CAPS Take 200 mg by mouth daily.  [name] gabapentin (NEURONTIN) 300 MG capsule TAKE ONE CAPSULE BY MOUTH EVERY DAY AT BEDTIME  . Garlic 1000 MG CAPS Take 1 capsule by mouth daily.  . Magnesium 400 MG CAPS Take 1 capsule by mouth daily.  . meloxicam (MOBIC) 15 MG tablet Take by mouth.  . Multiple  Vitamins-Minerals (MULTIVITAMIN WITH MINERALS) tablet Take 1 tablet by mouth daily.  . Omega-3 Fatty Acids (FISH OIL) 1200 MG CPDR Take 2 capsules by mouth daily.   . ramipril (ALTACE) 5 MG capsule Take 1 capsule by mouth daily. Reported on 07/29/2015  . TURMERIC PO Take 1,000 mg by mouth daily.    Past Medical History:  Diagnosis Date  . Hypertension     Past Surgical History:  Procedure Laterality Date  . BREAST CYST EXCISION Right    sebaceous cyst removed  . COLONOSCOPY  2006   Done in [place]  . COLONOSCOPY N/A 08/14/2014   Procedure: COLONOSCOPY;  Surgeon: Kieth Brightly, MD;  Location: [facility] ENDOSCOPY;  Service: Endoscopy;  Laterality: N/A;  . NASAL SINUS SURGERY  1980  . uterian ablation      Social History Social History   Tobacco Use  . Smoking status: Never Smoker  . Smokeless tobacco: Never Used  Substance Use Topics  . Alcohol use: Yes    Alcohol/week: 0.0 oz  . Drug use: No    Family History Family History  Problem Relation Age of Onset  . Breast cancer Mother        18  . Breast cancer Sister        8  No family history of bleeding/clotting disorders, porphyria or autoimmune disease   No Known Allergies   REVIEW OF SYSTEMS (Negative unless checked)  Constitutional: [] Weight loss  [] Fever  [] Chills Cardiac: [] Chest pain   [] Chest pressure   [] Palpitations   [] Shortness of  breath when laying flat   [] Shortness of breath with exertion. Vascular:  [] Pain in legs with walking   [x] Pain in legs standing  [] History of DVT   [] Phlebitis   [x] Swelling in legs   [x] Varicose veins   [] Non-healing ulcers Pulmonary:   [] Uses home oxygen   [] Productive cough   [] Hemoptysis   [] Wheeze  [] COPD   [] Asthma Neurologic:  [] Dizziness   [] Seizures   [] History of stroke   [] History of TIA  [] Aphasia   [] Vissual changes   [] Weakness or numbness in arm   [] Weakness or numbness in leg Musculoskeletal:   [] Joint swelling   [] Joint pain   [] Low back pain Hematologic:   [] Easy bruising  [] Easy bleeding   [] Hypercoagulable state   [] Anemic Gastrointestinal:  [] Diarrhea   [] Vomiting  [] Gastroesophageal reflux/heartburn   [] Difficulty swallowing. Genitourinary:  [] Chronic kidney disease   [] Difficult urination  [] Frequent urination   [] Blood in urine Skin:  [] Rashes   [] Ulcers  Psychological:  [] History of anxiety   []  History of major depression.  Physical Examination  Vitals:   04/25/17 1414  BP: (!) 151/80  Pulse: 93  Resp: 16  Weight: 194 lb (88 kg)  Height: 5\' 8"  (1.727 m)   Body mass index is 29.5 kg/m. Gen: WD/WN, NAD Head: /AT, No temporalis wasting.  Ear/Nose/Throat: Hearing grossly intact, nares w/o erythema or drainage, poor dentition Eyes: PER, EOMI, sclera nonicteric.  Neck: Supple, no masses.  No bruit or JVD.  Pulmonary:  Good air movement, clear to auscultation bilaterally, no use of accessory muscles.  Cardiac: RRR, normal S1, S2, no Murmurs. Vascular: Large varicosities present extensively greater than 5 mm bilaterally.  Mild venous stasis changes to the legs bilaterally.  2+ soft pitting edema Vessel Right Left  Radial Palpable Palpable  PT Palpable Palpable  DP Palpable Palpable   Gastrointestinal: soft, non-distended. No guarding/no peritoneal signs.  Musculoskeletal: M/S 5/5 throughout.  No deformity or atrophy.  Neurologic: CN 2-12 intact. Pain and light touch intact in extremities.  Symmetrical.  Speech is fluent. Motor exam as listed above. Psychiatric: Judgment intact, Mood & affect appropriate for pt's clinical situation. Dermatologic: No rashes or ulcers noted.  No changes consistent with cellulitis. Lymph : No Cervical lymphadenopathy, no lichenification or skin changes of chronic lymphedema.  CBC No results found for: WBC, HGB, HCT, MCV, PLT  BMET No results found for: NA, K, CL, CO2, GLUCOSE, BUN, CREATININE, CALCIUM, GFRNONAA, GFRAA CrCl cannot be calculated (No order found.).  COAG No results found for:  INR, PROTIME  Radiology No results found.  Assessment/Plan 1. Chronic venous insufficiency Recommend:  I have reviewed my discussion with the patient regarding  varicose veins and why they cause symptoms.  Patient will continue wearing graduated compression stockings class 1 on a daily basis, beginning first thing in the morning and removing them in the evening. The patient is instructed specifically not to sleep in the stockings.  The patient  will also continue using over-the-counter analgesics such as Motrin 600 mg po TID to help control the symptoms.  In addition, behavioral modification including elevation during the day was reiterated.    Conservative therapy will continue for now.  The importance of compression, elevation and exercise were stressed.  Pending the results of these changes the  patient will be reevaluated after three full months of conservative therapy.  At that time, if the symptoms are persistent, laser ablation to treat the patient's reflux will be discussed.    2. Varicose  veins of bilateral lower extremities with pain See #1 - VAS US LOWER EXTREMITY VENOUS REFLUX; Future  3. Primary osteoarthritis involving multiple joints Continue NSAID's as already ordered, these medications have been reviewed and there are no changes at this time.     Levora DredgeGregory Alisyn Lequire, MD  04/25/2017 9:54 PM

## 2017-05-12 ENCOUNTER — Encounter (INDEPENDENT_AMBULATORY_CARE_PROVIDER_SITE_OTHER): Payer: Self-pay | Admitting: Vascular Surgery

## 2017-05-12 ENCOUNTER — Ambulatory Visit (INDEPENDENT_AMBULATORY_CARE_PROVIDER_SITE_OTHER): Payer: Medicare PPO

## 2017-05-12 ENCOUNTER — Ambulatory Visit (INDEPENDENT_AMBULATORY_CARE_PROVIDER_SITE_OTHER): Payer: Medicare PPO | Admitting: Vascular Surgery

## 2017-05-12 VITALS — BP 121/69 | HR 87 | Resp 17 | Ht 68.0 in | Wt 194.0 lb

## 2017-05-12 DIAGNOSIS — I83813 Varicose veins of bilateral lower extremities with pain: Secondary | ICD-10-CM

## 2017-05-12 DIAGNOSIS — I872 Venous insufficiency (chronic) (peripheral): Secondary | ICD-10-CM | POA: Diagnosis not present

## 2017-05-15 ENCOUNTER — Encounter (INDEPENDENT_AMBULATORY_CARE_PROVIDER_SITE_OTHER): Payer: Self-pay | Admitting: Vascular Surgery

## 2017-05-15 NOTE — Progress Notes (Signed)
MRN : [id number]  Jordan Salazar is a 72 y.o. ([birth year]) female who presents with chief complaint of  Chief Complaint  Patient presents with  . Follow-up    bil ven reflux  .  History of Present Illness: The patient returns for followup evaluation 3 months after the initial visit. The patient continues to have pain in the lower extremities with dependency. The pain is lessened with elevation. Graduated compression stockings, Class I (20-30 mmHg), have been worn but the stockings do not eliminate the leg pain. Over-the-counter analgesics do not improve the symptoms. The degree of discomfort continues to interfere with daily activities. The patient notes the pain in the legs is causing problems with daily exercise, at the workplace and even with household activities and maintenance such as standing in the kitchen preparing meals and doing dishes.   Venous ultrasound shows normal deep venous system, no evidence of acute or chronic DVT.  Superficial reflux is present in several tributaries, the GSV are treated and remain occluded  Current Meds  Medication Sig  . aspirin 81 MG tablet Take 81 mg by mouth daily.  . Biotin 4098110000 MCG TABS Take by mouth daily.  . cholecalciferol (VITAMIN D) 400 UNITS TABS tablet Take 400 Units by mouth daily.   . Coenzyme Q10 (CO Q10) 100 MG CAPS Take 200 mg by mouth daily.  [name]. gabapentin (NEURONTIN) 300 MG capsule TAKE ONE CAPSULE BY MOUTH EVERY DAY AT BEDTIME  . Garlic 1000 MG CAPS Take 1 capsule by mouth daily.  . Magnesium 400 MG CAPS Take 1 capsule by mouth daily.  . meloxicam (MOBIC) 15 MG tablet Take by mouth.  . Multiple Vitamins-Minerals (MULTIVITAMIN WITH MINERALS) tablet Take 1 tablet by mouth daily.  . Omega-3 Fatty Acids (FISH OIL) 1200 MG CPDR Take 2 capsules by mouth daily.   . ramipril (ALTACE) 5 MG capsule Take 1 capsule by mouth daily. Reported on 07/29/2015  . TURMERIC PO Take 1,000 mg by mouth daily.    Past Medical History:  Diagnosis  Date  . Hypertension     Past Surgical History:  Procedure Laterality Date  . BREAST CYST EXCISION Right    sebaceous cyst removed  . COLONOSCOPY  2006   [place] in [place]  . COLONOSCOPY N/A 08/14/2014   Procedure: COLONOSCOPY;  Surgeon: Kieth BrightlySeeplaputhur G Sankar, MD;  Location: [facility] ENDOSCOPY;  Service: Endoscopy;  Laterality: N/A;  . NASAL SINUS SURGERY  1980  . uterian ablation      Social History Social History   Tobacco Use  . Smoking status: Never Smoker  . Smokeless tobacco: Never Used  Substance Use Topics  . Alcohol use: Yes    Alcohol/week: 0.0 oz  . Drug use: No    Family History Family History  Problem Relation Age of Onset  . Breast cancer Mother        9760  . Breast cancer Sister        6840    No Known Allergies   REVIEW OF SYSTEMS (Negative unless checked)  Constitutional: [] Weight loss  [] Fever  [] Chills Cardiac: [] Chest pain   [] Chest pressure   [] Palpitations   [] Shortness of breath when laying flat   [] Shortness of breath with exertion. Vascular:  [] Pain in legs with walking   [x] Pain in legs at rest  [] History of DVT   [] Phlebitis   [x] Swelling in legs   [x] Varicose veins   [] Non-healing ulcers Pulmonary:   [] Uses home oxygen   [] Productive cough   []   Hemoptysis   [] Wheeze  [] COPD   [] Asthma Neurologic:  [] Dizziness   [] Seizures   [] History of stroke   [] History of TIA  [] Aphasia   [] Vissual changes   [] Weakness or numbness in arm   [] Weakness or numbness in leg Musculoskeletal:   [] Joint swelling   [] Joint pain   [] Low back pain Hematologic:  [] Easy bruising  [] Easy bleeding   [] Hypercoagulable state   [] Anemic Gastrointestinal:  [] Diarrhea   [] Vomiting  [] Gastroesophageal reflux/heartburn   [] Difficulty swallowing. Genitourinary:  [] Chronic kidney disease   [] Difficult urination  [] Frequent urination   [] Blood in urine Skin:  [] Rashes   [] Ulcers  Psychological:  [] History of anxiety   []  History of major depression.  Physical Examination  Vitals:     05/12/17 1537  BP: 121/69  Pulse: 87  Resp: 17  Weight: 194 lb (88 kg)  Height: 5\' 8"  (1.727 m)   Body mass index is 29.5 kg/m. Gen: WD/WN, NAD Head: [place]/AT, No temporalis wasting.  Ear/Nose/Throat: Hearing grossly intact, nares w/o erythema or drainage Eyes: PER, EOMI, sclera nonicteric.  Neck: Supple, no large masses.   Pulmonary:  Good air movement, no audible wheezing bilaterally, no use of accessory muscles.  Cardiac: RRR, no JVD Vascular: scattered varicosities present bilaterally.  Mild venous stasis changes to the legs bilaterally.  2+ soft pitting edema Vessel Right Left  Radial Palpable Palpable  PT Palpable Palpable  DP Palpable Palpable  Gastrointestinal: Non-distended. No guarding/no peritoneal signs.  Musculoskeletal: M/S 5/5 throughout.  No deformity or atrophy.  Neurologic: CN 2-12 intact. Symmetrical.  Speech is fluent. Motor exam as listed above. Psychiatric: Judgment intact, Mood & affect appropriate for pt's clinical situation. Dermatologic: No rashes or ulcers noted.  No changes consistent with cellulitis. Lymph : No lichenification or skin changes of chronic lymphedema.  CBC No results found for: WBC, HGB, HCT, MCV, PLT  BMET No results found for: NA, K, CL, CO2, GLUCOSE, BUN, CREATININE, CALCIUM, GFRNONAA, GFRAA CrCl cannot be calculated (No order found.).  COAG No results found for: INR, PROTIME  Radiology   Assessment/Plan 1. Varicose veins of bilateral lower extremities with pain Recommend:  The patient is complaining of varicose veins.    I have had a long discussion with the patient regarding  varicose veins and why they cause symptoms.  Patient will begin wearing graduated compression stockings on a daily basis, beginning first thing in the morning and removing them in the evening. The patient is instructed specifically not to sleep in the stockings.    The patient  will also begin using over-the-counter analgesics such as Motrin 600 mg  po TID to help control the symptoms as needed.    In addition, behavioral modification including elevation during the day will be initiated, utilizing a recliner was recommended.  The patient is also instructed to continue exercising such as walking 4-5 times per week.  At this time the patient wishes to continue conservative therapy and is not interested in more invasive treatments such as laser ablation and sclerotherapy.  The Patient will follow up PRN if the symptoms worsen.  2. Chronic venous insufficiency Recommend:  The patient is complaining of varicose veins.    I have had a long discussion with the patient regarding  varicose veins and why they cause symptoms.  Patient will begin wearing graduated compression stockings on a daily basis, beginning first thing in the morning and removing them in the evening. The patient is instructed specifically not to sleep in the  stockings.    The patient  will also begin using over-the-counter analgesics such as Motrin 600 mg po TID to help control the symptoms as needed.    In addition, behavioral modification including elevation during the day will be initiated, utilizing a recliner was recommended.  The patient is also instructed to continue exercising such as walking 4-5 times per week.  At this time the patient wishes to continue conservative therapy and is not interested in more invasive treatments such as laser ablation and sclerotherapy.  The Patient will follow up PRN if the symptoms worsen.    Levora Dredge, MD  05/15/2017 11:18 AM

## 2017-06-23 ENCOUNTER — Other Ambulatory Visit: Payer: Self-pay

## 2017-06-23 DIAGNOSIS — Z1231 Encounter for screening mammogram for malignant neoplasm of breast: Secondary | ICD-10-CM

## 2017-07-27 ENCOUNTER — Ambulatory Visit
Admission: RE | Admit: 2017-07-27 | Discharge: 2017-07-27 | Disposition: A | Discharge status: Home / Self Care | Payer: Medicare PPO | Source: Ambulatory Visit | Attending: General Surgery | Admitting: General Surgery

## 2017-07-27 DIAGNOSIS — Z1231 Encounter for screening mammogram for malignant neoplasm of breast: Secondary | ICD-10-CM | POA: Insufficient documentation

## 2017-08-09 ENCOUNTER — Ambulatory Visit (INDEPENDENT_AMBULATORY_CARE_PROVIDER_SITE_OTHER): Payer: Medicare PPO | Admitting: General Surgery

## 2017-08-09 ENCOUNTER — Encounter: Payer: Self-pay | Admitting: General Surgery

## 2017-08-09 VITALS — BP 118/64 | HR 68 | Resp 14 | Ht 68.0 in | Wt 193.0 lb

## 2017-08-09 DIAGNOSIS — Z803 Family history of malignant neoplasm of breast: Secondary | ICD-10-CM | POA: Diagnosis not present

## 2017-08-09 NOTE — Progress Notes (Signed)
Patient ID: Jordan Salazar, female   DOB: June 28, 1945, 72 y.o.   MRN: [id number]  Chief Complaint  Patient presents with  . Follow-up    HPI Jordan Salazar is a 72 y.o. female who presents for a breast evaluation. The most recent mammogram was done on 08/01/2017.  Patient does perform regular self breast checks and gets regular mammograms done.    HPI  Past Medical History:  Diagnosis Date  . Hypertension     Past Surgical History:  Procedure Laterality Date  . ABDOMINAL HYSTERECTOMY  2018  . BLADDER SURGERY  2018  . BREAST CYST EXCISION Right    sebaceous cyst removed  . COLONOSCOPY  2006   Done in [place]  . COLONOSCOPY N/A 08/14/2014   Procedure: COLONOSCOPY;  Surgeon: Kieth Brightly, MD;  Location: [facility] ENDOSCOPY;  Service: Endoscopy;  Laterality: N/A;  . NASAL SINUS SURGERY  1980  . uterian ablation      Family History  Problem Relation Age of Onset  . Breast cancer Mother        51  . Breast cancer Sister        42    Social History Social History   Tobacco Use  . Smoking status: Never Smoker  . Smokeless tobacco: Never Used  Substance Use Topics  . Alcohol use: Yes    Alcohol/week: 0.0 oz  . Drug use: No    No Known Allergies  Current Outpatient Medications  Medication Sig Dispense Refill  . Biotin 40981 MCG TABS Take by mouth daily.    . cholecalciferol (VITAMIN D) 400 UNITS TABS tablet Take 400 Units by mouth daily.     . Coenzyme Q10 (CO Q10) 100 MG CAPS Take 200 mg by mouth daily.    [name] gabapentin (NEURONTIN) 300 MG capsule TAKE ONE CAPSULE BY MOUTH EVERY DAY AT BEDTIME    . Garlic 1000 MG CAPS Take 1 capsule by mouth daily.    . Magnesium 400 MG CAPS Take 1 capsule by mouth daily.    . Melatonin-Pyridoxine (MELATIN PO) Take by mouth.    . meloxicam (MOBIC) 15 MG tablet Take by mouth.    . Multiple Vitamins-Minerals (MULTIVITAMIN WITH MINERALS) tablet Take 1 tablet by mouth daily.    . Omega-3 Fatty Acids (FISH OIL) 1200 MG CPDR Take  2 capsules by mouth daily.     . ramipril (ALTACE) 5 MG capsule Take 1 capsule by mouth daily. Reported on 07/29/2015    . TURMERIC PO Take 1,000 mg by mouth daily.     No current facility-administered medications for this visit.     Review of Systems Review of Systems  Constitutional: Negative.   Respiratory: Negative.   Cardiovascular: Negative.     Blood pressure 118/64, pulse 68, resp. rate 14, height  (1.727 m), weight 193 lb (87.5 kg).  Physical Exam Physical Exam  Constitutional: She is oriented to person, place, and time. She appears well-developed and well-nourished.  Cardiovascular: Normal rate, regular rhythm and normal heart sounds.  Pulmonary/Chest: Effort normal and breath sounds normal.    Lymphadenopathy:    She has no cervical adenopathy.    She has no axillary adenopathy.       Right: No supraclavicular adenopathy present.       Left: No supraclavicular adenopathy present.  Neurological: She is alert and oriented to person, place, and time.  Skin: Skin is warm and dry.    Data Reviewed Bilateral screening mammograms of August 01, 2017 were reviewed.  Primarily fatty replaced breast.  BI-RADS-1.  Assessment    Unremarkable breast exam.  Family history of breast cancer.    Plan  Patient to check with her sister about geratic testing.  The sister should be covered under Medicare benefits based on age of diagnosis.  If an abnormality is identified, specific testing of the patient herself can be undertaken at much lower cost.  Patient will be asked to return to the office in one year with a bilateral screening mammogram. The patient is aware to call back for any questions or concerns.    HPI, Physical Exam, Assessment and Plan have been scribed under the direction and in the presence of Donnalee Curry, MD.  Ples Specter, CMA  I have completed the exam and reviewed the above documentation for accuracy and completeness.  I agree with the above.   Museum/gallery conservator has been used and any errors in dictation or transcription are unintentional.  Donnalee Curry, M.D., F.A.C.S.  Jordan Salazar 08/09/2017, 1:15 PM

## 2017-08-09 NOTE — Patient Instructions (Signed)
  Patient to check with her sister about geratic testing. Patient will be asked to return to the office in one year with a bilateral screening mammogram. The patient is aware to call back for any questions or concerns.

## 2018-03-22 ENCOUNTER — Other Ambulatory Visit: Payer: Self-pay | Admitting: Internal Medicine

## 2018-03-22 DIAGNOSIS — Z1231 Encounter for screening mammogram for malignant neoplasm of breast: Secondary | ICD-10-CM

## 2018-08-10 ENCOUNTER — Ambulatory Visit: Payer: Medicare PPO | Admitting: General Surgery

## 2018-08-29 ENCOUNTER — Ambulatory Visit: Payer: Medicare PPO | Admitting: General Surgery

## 2018-09-14 ENCOUNTER — Ambulatory Visit
Admission: RE | Admit: 2018-09-14 | Discharge: 2018-09-14 | Disposition: A | Discharge status: Home / Self Care | Payer: Medicare Other | Source: Ambulatory Visit | Attending: Internal Medicine | Admitting: Internal Medicine

## 2018-09-14 ENCOUNTER — Other Ambulatory Visit: Payer: Self-pay

## 2018-09-14 DIAGNOSIS — Z1231 Encounter for screening mammogram for malignant neoplasm of breast: Secondary | ICD-10-CM | POA: Diagnosis present

## 2018-09-19 ENCOUNTER — Other Ambulatory Visit: Payer: Self-pay

## 2018-09-19 ENCOUNTER — Ambulatory Visit (INDEPENDENT_AMBULATORY_CARE_PROVIDER_SITE_OTHER): Payer: Medicare Other | Admitting: General Surgery

## 2018-09-19 ENCOUNTER — Encounter: Payer: Self-pay | Admitting: General Surgery

## 2018-09-19 VITALS — BP 136/79 | HR 80 | Temp 97.7°F | Ht 68.0 in | Wt 190.0 lb

## 2018-09-19 DIAGNOSIS — Z803 Family history of malignant neoplasm of breast: Secondary | ICD-10-CM | POA: Diagnosis not present

## 2018-09-19 DIAGNOSIS — Z1231 Encounter for screening mammogram for malignant neoplasm of breast: Secondary | ICD-10-CM | POA: Diagnosis not present

## 2018-09-19 NOTE — Progress Notes (Signed)
Patient ID: Jordan Salazar, female   DOB: 08/14/1945, 73 y.o.   MRN: [id number]  Chief Complaint  Patient presents with  . Follow-up    HPI Jordan Salazar is a 73 y.o. female who presents for a breast evaluation. The most recent mammogram was done on 09/14/2018.Jordan Salazar  Patient does perform regular self breast checks and gets regular mammograms done.   The patient sister declined BRCA testing. HPI  Past Medical History:  Diagnosis Date  . Hypertension     Past Surgical History:  Procedure Laterality Date  . ABDOMINAL HYSTERECTOMY  2018  . BLADDER SURGERY  2018  . BREAST CYST EXCISION Right    sebaceous cyst removed  . COLONOSCOPY  2006   Done in [place]  . COLONOSCOPY N/A 08/14/2014   Procedure: COLONOSCOPY;  Surgeon: Christene Lye, MD;  Location: [facility] ENDOSCOPY;  Service: Endoscopy;  Laterality: N/A;  . NASAL SINUS SURGERY  1980  . uterian ablation      Family History  Problem Relation Age of Onset  . Breast cancer Mother        22  . Breast cancer Sister        41    Social History Social History   Tobacco Use  . Smoking status: Never Smoker  . Smokeless tobacco: Never Used  Substance Use Topics  . Alcohol use: Yes    Alcohol/week: 0.0 standard drinks  . Drug use: No    No Known Allergies  Current Outpatient Medications  Medication Sig Dispense Refill  . Biotin 10000 MCG TABS Take by mouth daily.    . cholecalciferol (VITAMIN D) 400 UNITS TABS tablet Take 400 Units by mouth daily.     . Coenzyme Q10 (CO Q10) 100 MG CAPS Take 200 mg by mouth daily.    Jordan Salazar gabapentin (NEURONTIN) 300 MG capsule TAKE ONE CAPSULE BY MOUTH EVERY DAY AT BEDTIME    . Garlic 9357 MG CAPS Take 1 capsule by mouth daily.    . Magnesium 400 MG CAPS Take 1 capsule by mouth daily.    . Melatonin-Pyridoxine (MELATIN PO) Take by mouth.    . meloxicam (MOBIC) 15 MG tablet Take by mouth.    . Multiple Vitamins-Minerals (MULTIVITAMIN WITH MINERALS) tablet Take 1 tablet by mouth daily.     . Omega-3 Fatty Acids (FISH OIL) 1200 MG CPDR Take 2 capsules by mouth daily.     . ramipril (ALTACE) 5 MG capsule Take 1 capsule by mouth daily. Reported on 07/29/2015    . TURMERIC PO Take 1,000 mg by mouth daily.     No current facility-administered medications for this visit.     Review of Systems Review of Systems  Constitutional: Negative.   Respiratory: Negative.   Cardiovascular: Negative.     Blood pressure 136/79, pulse 80, temperature 97.7 F (36.5 C), temperature source Skin, height _0  (1.727 m), weight 190 lb (86.2 kg), SpO2 96 %.  Physical Exam Physical Exam Constitutional:      Appearance: She is well-developed.  Eyes:     General: No scleral icterus.    Conjunctiva/sclera: Conjunctivae normal.  Neck:     Musculoskeletal: Neck supple.  Cardiovascular:     Rate and Rhythm: Normal rate and regular rhythm.     Heart sounds: Normal heart sounds.  Pulmonary:     Effort: Pulmonary effort is normal.     Breath sounds: Normal breath sounds.  Chest:     Breasts:  Right: Normal.        Left: Normal.  Lymphadenopathy:     Cervical: No cervical adenopathy.     Upper Body:     Right upper body: No supraclavicular or axillary adenopathy.     Left upper body: No supraclavicular or axillary adenopathy.  Skin:    General: Skin is warm and dry.  Neurological:     Mental Status: She is alert and oriented to person, place, and time.     Data Reviewed Bilateral screening mammograms dated April 15, 2018 were independently reviewed.  BI-RADS-1  Assessment Benign breast exam.  Plan The patient's breast exam has been stable for several years.  At this time we are both comfortable with the patient having her annual exams and mammograms arranged by her PCP.  If there is ever a question on either physical exam or mammography she is welcome to return.  Patient to return to her PCP for mammograms yearly mammogram and clinical exams. The patient is aware to call  back for any questions or concerns.    HPI, Physical Exam, Assessment and Plan have been scribed under the direction and in the presence of Hervey Ard, MD.  Gaspar Cola, CMA\  I have completed the exam and reviewed the above documentation for accuracy and completeness.  I agree with the above.  Haematologist has been used and any errors in dictation or transcription are unintentional.  Hervey Ard, M.D., F.A.C.S.  Forest Gleason Byrnett 09/20/2018, 3:13 PM

## 2018-09-19 NOTE — Patient Instructions (Addendum)
Patient to return to her PCP for mammograms yearly. The patient is aware to call back for any questions or concerns.

## 2018-11-16 ENCOUNTER — Other Ambulatory Visit: Payer: Self-pay

## 2018-11-16 ENCOUNTER — Ambulatory Visit
Admission: RE | Admit: 2018-11-16 | Discharge: 2018-11-16 | Disposition: A | Discharge status: Home / Self Care | Payer: Medicare Other | Source: Ambulatory Visit | Attending: Internal Medicine | Admitting: Internal Medicine

## 2018-11-16 DIAGNOSIS — I10 Essential (primary) hypertension: Secondary | ICD-10-CM | POA: Diagnosis present

## 2019-08-13 ENCOUNTER — Other Ambulatory Visit: Payer: Self-pay | Admitting: Internal Medicine

## 2019-08-13 DIAGNOSIS — Z1231 Encounter for screening mammogram for malignant neoplasm of breast: Secondary | ICD-10-CM

## 2019-09-17 ENCOUNTER — Other Ambulatory Visit: Payer: Self-pay | Admitting: Internal Medicine

## 2019-09-17 DIAGNOSIS — E2839 Other primary ovarian failure: Secondary | ICD-10-CM

## 2019-09-18 ENCOUNTER — Ambulatory Visit
Admission: RE | Admit: 2019-09-18 | Discharge: 2019-09-18 | Disposition: A | Discharge status: Home / Self Care | Payer: Medicare Other | Source: Ambulatory Visit | Attending: Internal Medicine | Admitting: Internal Medicine

## 2019-09-18 DIAGNOSIS — E2839 Other primary ovarian failure: Secondary | ICD-10-CM | POA: Diagnosis present

## 2019-09-18 DIAGNOSIS — Z1231 Encounter for screening mammogram for malignant neoplasm of breast: Secondary | ICD-10-CM | POA: Insufficient documentation

## 2019-09-24 ENCOUNTER — Other Ambulatory Visit: Payer: Self-pay | Admitting: Internal Medicine

## 2019-09-24 DIAGNOSIS — R928 Other abnormal and inconclusive findings on diagnostic imaging of breast: Secondary | ICD-10-CM

## 2019-09-24 DIAGNOSIS — N6320 Unspecified lump in the left breast, unspecified quadrant: Secondary | ICD-10-CM

## 2019-09-25 ENCOUNTER — Ambulatory Visit
Admission: RE | Admit: 2019-09-25 | Discharge: 2019-09-25 | Disposition: A | Discharge status: Home / Self Care | Payer: Medicare Other | Source: Ambulatory Visit | Attending: Internal Medicine | Admitting: Internal Medicine

## 2019-09-25 DIAGNOSIS — R928 Other abnormal and inconclusive findings on diagnostic imaging of breast: Secondary | ICD-10-CM | POA: Insufficient documentation

## 2019-09-25 DIAGNOSIS — N6320 Unspecified lump in the left breast, unspecified quadrant: Secondary | ICD-10-CM | POA: Diagnosis present

## 2020-01-10 ENCOUNTER — Other Ambulatory Visit: Payer: Self-pay

## 2020-01-10 ENCOUNTER — Other Ambulatory Visit: Payer: BC Managed Care – PPO

## 2020-01-10 DIAGNOSIS — Z20822 Contact with and (suspected) exposure to covid-19: Secondary | ICD-10-CM

## 2020-01-11 LAB — SARS-COV-2, NAA 2 DAY TAT

## 2020-01-11 LAB — NOVEL CORONAVIRUS, NAA: SARS-CoV-2, NAA: DETECTED — AB

## 2020-01-12 ENCOUNTER — Other Ambulatory Visit (HOSPITAL_COMMUNITY): Payer: Self-pay | Admitting: Adult Health

## 2020-01-12 DIAGNOSIS — U071 COVID-19: Secondary | ICD-10-CM

## 2020-01-12 NOTE — Progress Notes (Signed)
I connected by phone with Colman Cater on 01/12/2020 at 3:55 PM to discuss the potential use of a new treatment for mild to moderate COVID-19 viral infection in non-hospitalized patients.  This patient is a 74 y.o. female that meets the FDA criteria for Emergency Use Authorization of COVID monoclonal antibody casirivimab/imdevimab or bamlanivimab/eteseviamb.  Has a (+) direct SARS-CoV-2 viral test result  Has mild or moderate COVID-19   Is NOT hospitalized due to COVID-19  Is within 10 days of symptom onset  Has at least one of the high risk factor(s) for progression to severe COVID-19 and/or hospitalization as defined in EUA.  Specific high risk criteria : Older age (>/= 73 yo)   Sx onset 10/7   I have spoken and communicated the following to the patient or parent/caregiver regarding COVID monoclonal antibody treatment:  1. FDA has authorized the emergency use for the treatment of mild to moderate COVID-19 in adults and pediatric patients with positive results of direct SARS-CoV-2 viral testing who are 20 years of age and older weighing at least 40 kg, and who are at high risk for progressing to severe COVID-19 and/or hospitalization.  2. The significant known and potential risks and benefits of COVID monoclonal antibody, and the extent to which such potential risks and benefits are unknown.  3. Information on available alternative treatments and the risks and benefits of those alternatives, including clinical trials.  4. Patients treated with COVID monoclonal antibody should continue to self-isolate and use infection control measures (e.g., wear mask, isolate, social distance, avoid sharing personal items, clean and disinfect high touch surfaces, and frequent handwashing) according to CDC guidelines.   5. The patient or parent/caregiver has the option to accept or refuse COVID monoclonal antibody treatment.  After reviewing this information with the patient, the patient has  agreed to receive one of the available covid 19 monoclonal antibodies and will be provided an appropriate fact sheet prior to infusion. Noreene Filbert, NP 01/12/2020 3:55 PM

## 2020-01-13 ENCOUNTER — Ambulatory Visit (HOSPITAL_COMMUNITY)
Admission: RE | Admit: 2020-01-13 | Discharge: 2020-01-13 | Disposition: A | Discharge status: Home / Self Care | Payer: Medicare Other | Source: Ambulatory Visit | Attending: Pulmonary Disease | Admitting: Pulmonary Disease

## 2020-01-13 DIAGNOSIS — Z23 Encounter for immunization: Secondary | ICD-10-CM | POA: Insufficient documentation

## 2020-01-13 DIAGNOSIS — U071 COVID-19: Secondary | ICD-10-CM | POA: Insufficient documentation

## 2020-01-13 MED ORDER — FAMOTIDINE IN NACL 20-0.9 MG/50ML-% IV SOLN: 20.0000 mg | Freq: Once | INTRAVENOUS | Status: DC | PRN

## 2020-01-13 MED ORDER — METHYLPREDNISOLONE SODIUM SUCC 125 MG IJ SOLR: 125.0000 mg | Freq: Once | INTRAMUSCULAR | Status: DC | PRN

## 2020-01-13 MED ORDER — ALBUTEROL SULFATE HFA 108 (90 BASE) MCG/ACT IN AERS: 2.0000 | INHALATION_SPRAY | Freq: Once | RESPIRATORY_TRACT | Status: DC | PRN

## 2020-01-13 MED ORDER — DIPHENHYDRAMINE HCL 50 MG/ML IJ SOLN: 50.0000 mg | Freq: Once | INTRAMUSCULAR | Status: DC | PRN

## 2020-01-13 MED ORDER — EPINEPHRINE 0.3 MG/0.3ML IJ SOAJ: 0.3000 mg | Freq: Once | INTRAMUSCULAR | Status: DC | PRN

## 2020-01-13 MED ORDER — SODIUM CHLORIDE 0.9 % IV SOLN: Freq: Once | INTRAVENOUS | Status: AC

## 2020-01-13 MED ORDER — SODIUM CHLORIDE 0.9 % IV SOLN: INTRAVENOUS | Status: DC | PRN

## 2020-01-13 NOTE — Progress Notes (Signed)
  Diagnosis: COVID-19  Physician:dr wright  Procedure: [facility] Med: bamlanivimab\etesevimab infusion - Provided patient with bamlanimivab\etesevimab fact sheet for patients, parents and caregivers prior to infusion.  Complications: No immediate complications noted.  Discharge: Discharged home   Althia Egolf S Castella Lerner 01/13/2020  

## 2020-01-13 NOTE — Discharge Instructions (Signed)

## 2020-04-16 LAB — COLOGUARD: COLOGUARD: NEGATIVE

## 2020-09-17 ENCOUNTER — Other Ambulatory Visit: Payer: Self-pay | Admitting: Internal Medicine

## 2020-09-17 DIAGNOSIS — Z1231 Encounter for screening mammogram for malignant neoplasm of breast: Secondary | ICD-10-CM

## 2020-09-23 ENCOUNTER — Ambulatory Visit
Admission: RE | Admit: 2020-09-23 | Discharge: 2020-09-23 | Disposition: A | Discharge status: Home / Self Care | Payer: Medicare Other | Source: Ambulatory Visit | Attending: Internal Medicine | Admitting: Internal Medicine

## 2020-09-23 ENCOUNTER — Other Ambulatory Visit: Payer: Self-pay

## 2020-09-23 DIAGNOSIS — Z1231 Encounter for screening mammogram for malignant neoplasm of breast: Secondary | ICD-10-CM | POA: Insufficient documentation

## 2020-11-30 IMAGING — MG MM DIGITAL DIAGNOSTIC UNILAT*L* W/ TOMO W/ CAD
4 series · 4 of 12 positions shown · non-contrast
Comparison: Previous exam(s).

CLINICAL DATA: 73-year-old female for further evaluation of
possible LEFT breast mass identified on screening mammogram.

EXAM:
DIGITAL DIAGNOSTIC LEFT MAMMOGRAM WITH TOMO
ULTRASOUND LEFT BREAST

[L MLO synth-2D]
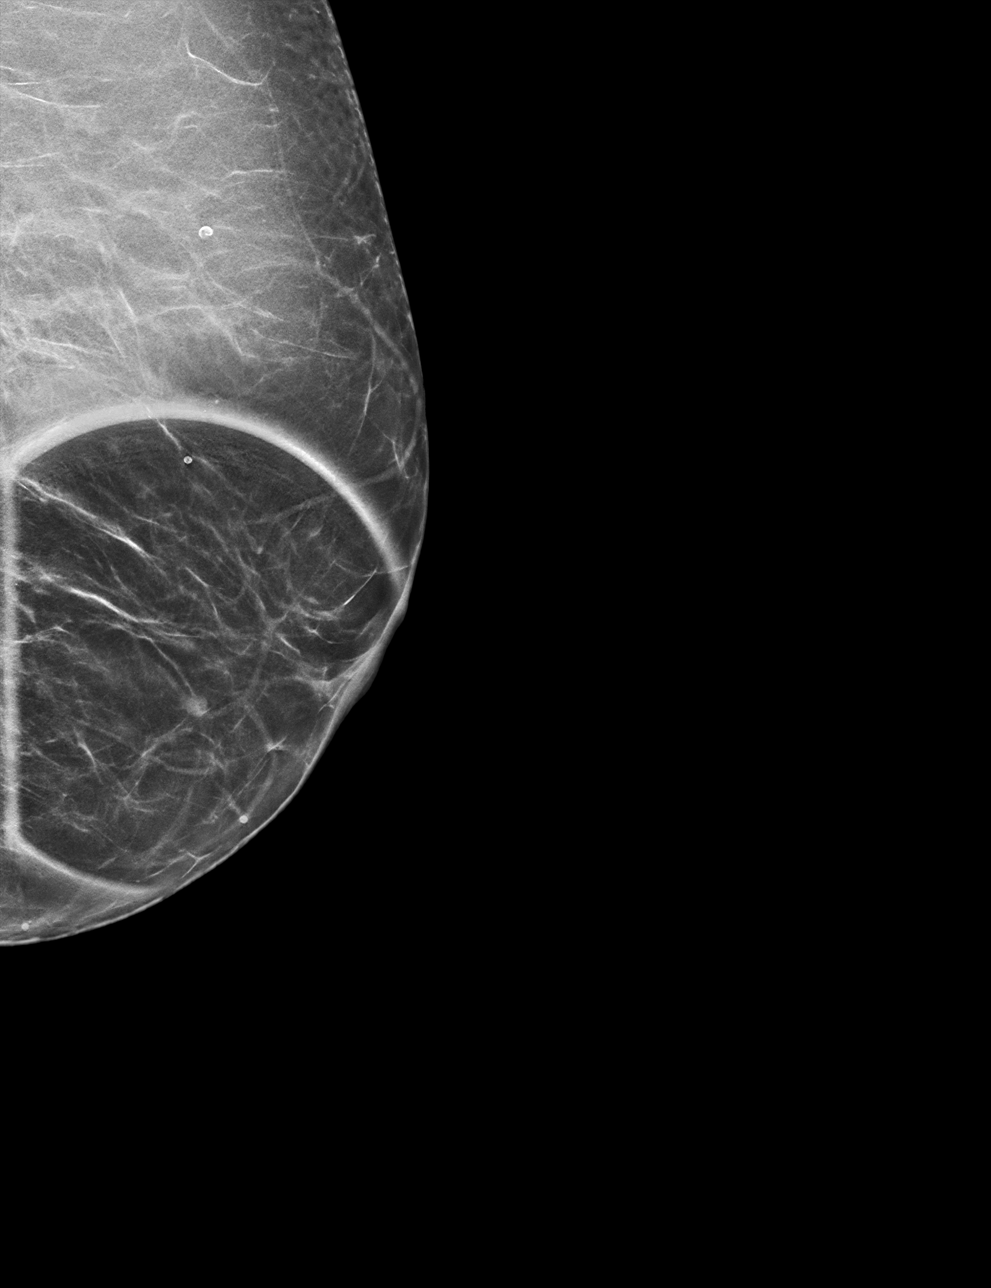

[L CC synth-2D]
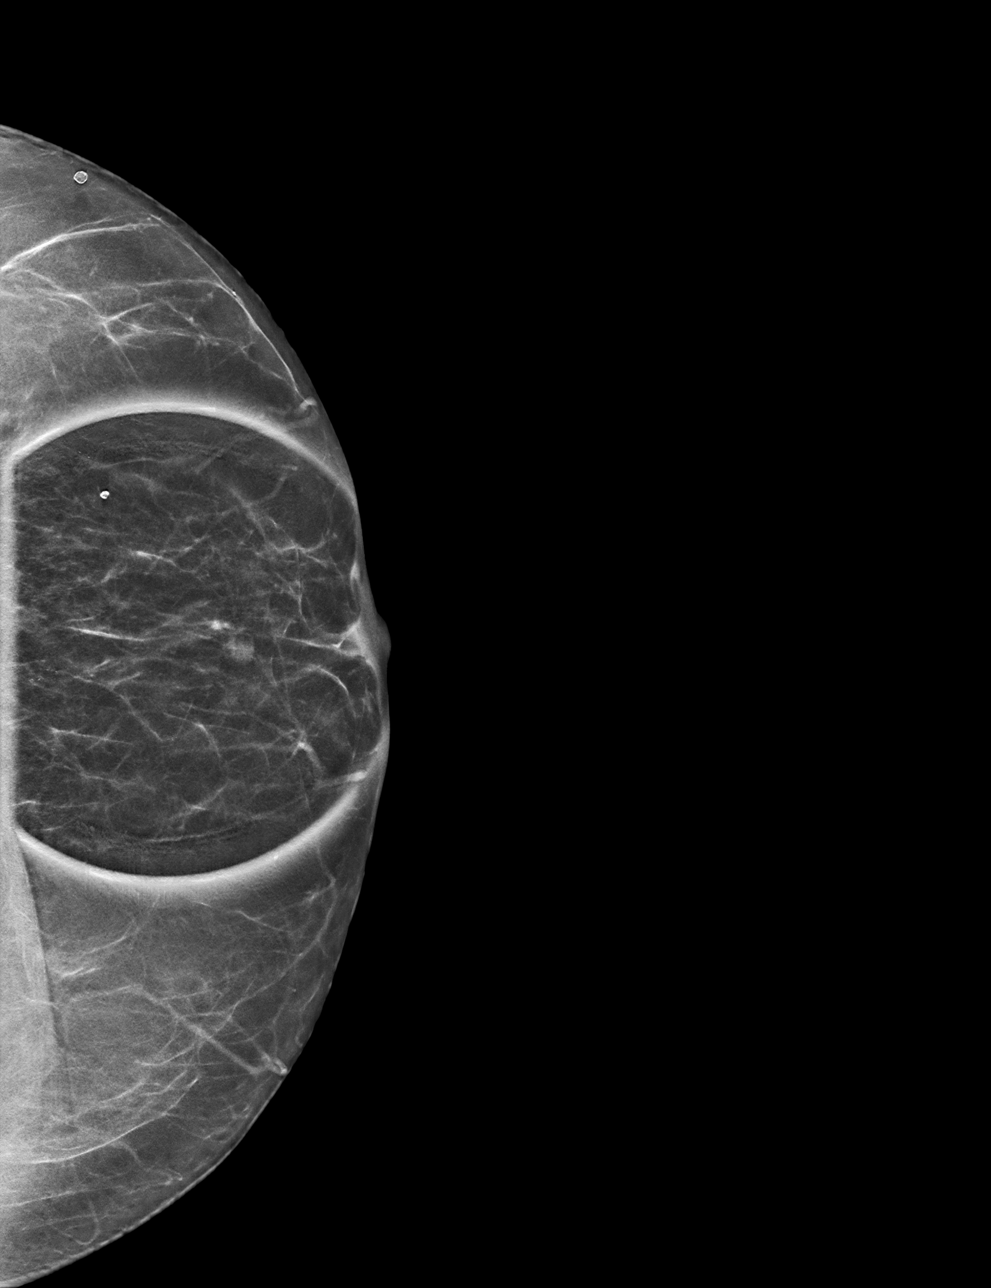

[L MLO tomo · tomo slice 27/52.0]
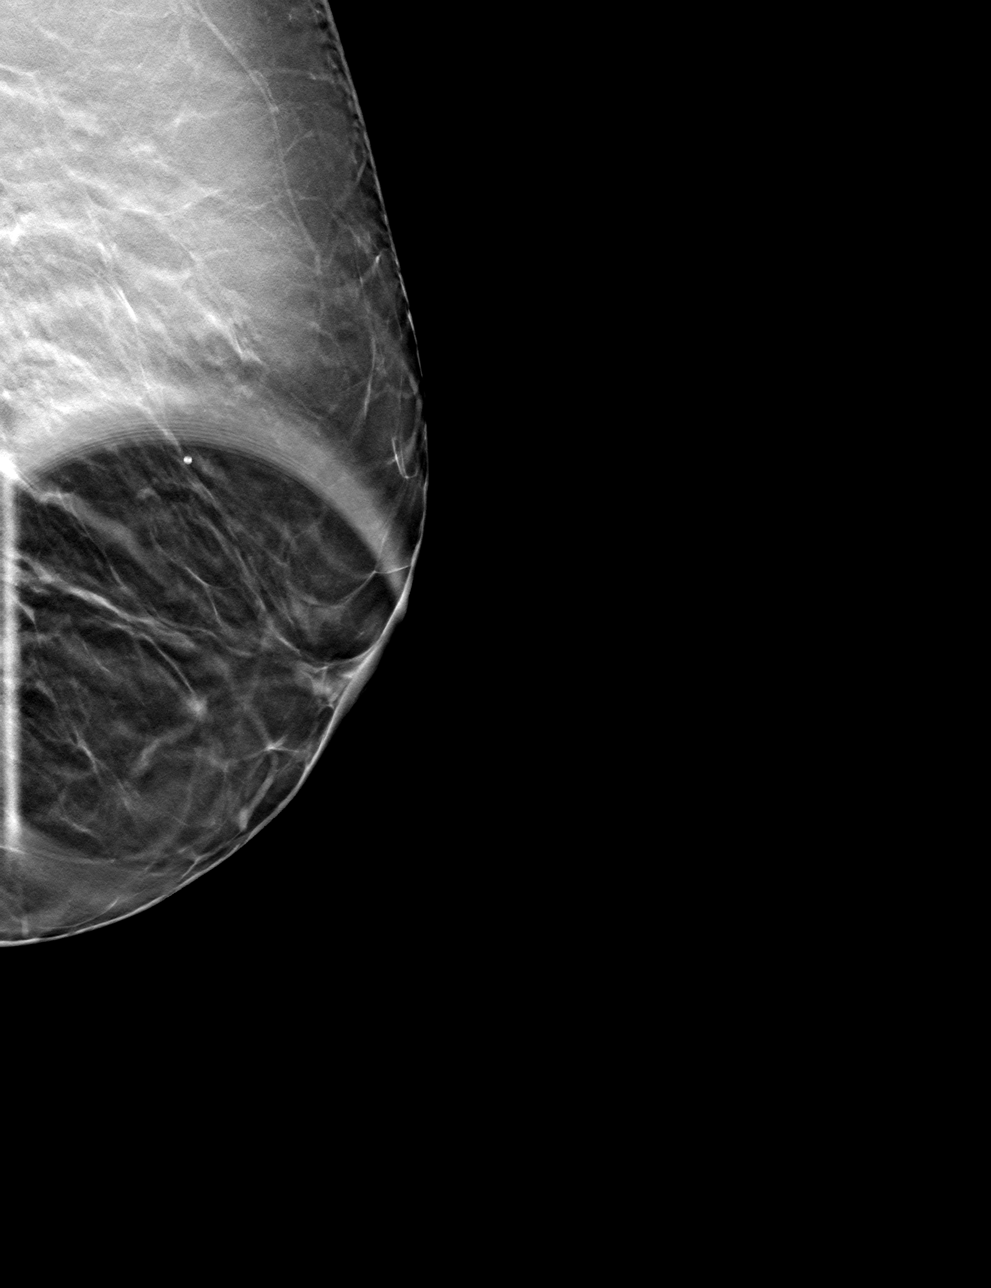

[L CC tomo · tomo slice 24/47.0]
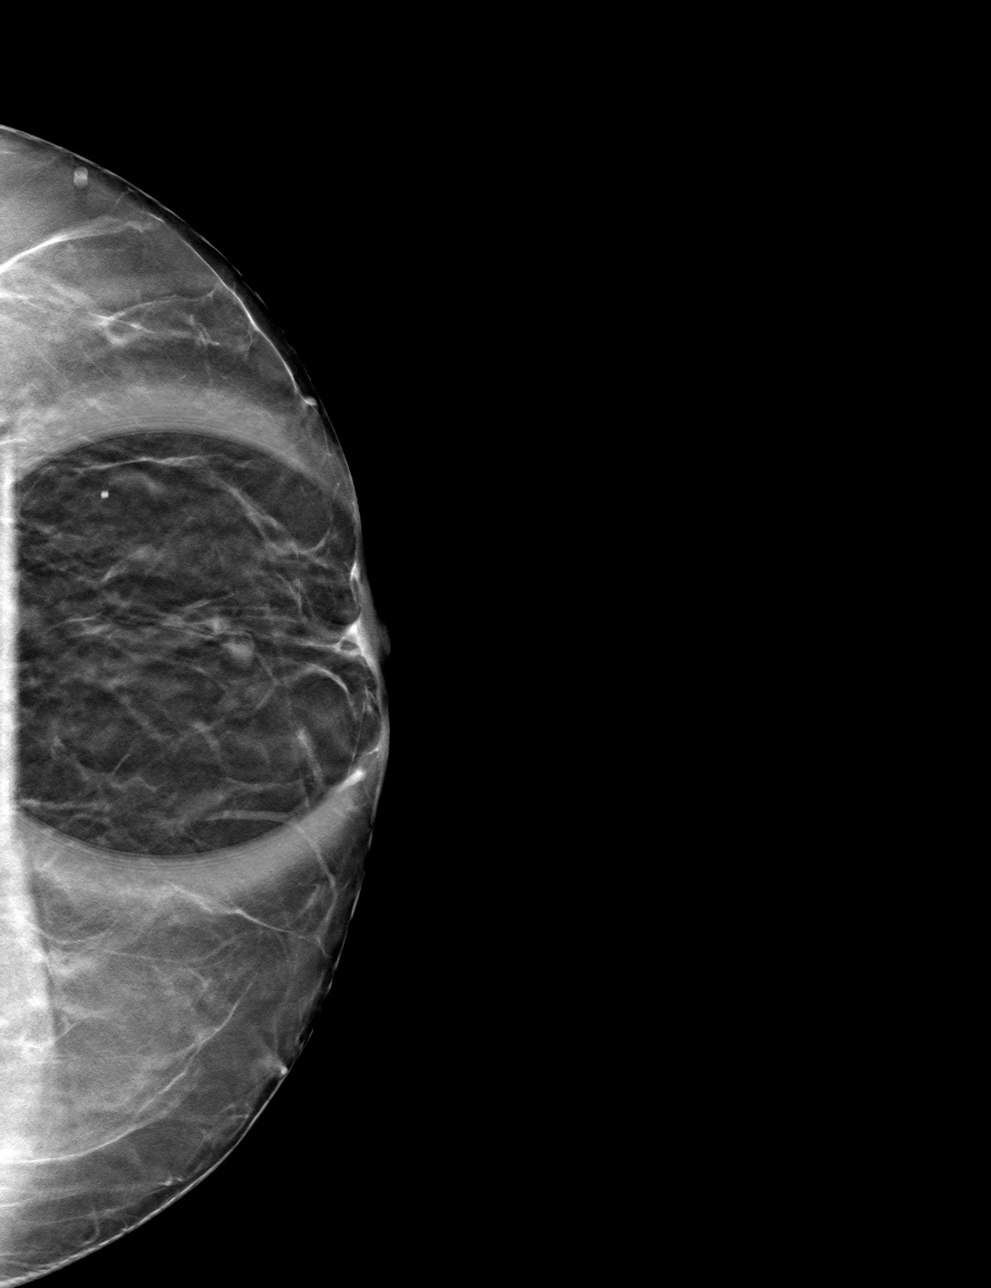

[4 of 12 positions shown; findings below may reference images not displayed]

ACR Breast Density Category b: There are scattered areas of
fibroglandular density.
FINDINGS: 2D/3D spot compression views of the LEFT breast demonstrate a
persistent circumscribed mass in the LOWER RETROAREOLAR LEFT breast,
with a possible fatty hilum.

Targeted ultrasound is performed, showing a 0.4 cm benign
intraparenchymal lymph node at the [DATE] position of the RETROAREOLAR
LEFT breast, corresponding to the mammographic finding.
IMPRESSION: Benign intraparenchymal lymph node in the RETROAREOLAR LEFT breast,
corresponding to the screening study finding.

RECOMMENDATION:
Bilateral screening mammogram in 1 year.

I have discussed the findings and recommendations with the patient.
If applicable, a reminder letter will be sent to the patient
regarding the next appointment.

BI-RADS CATEGORY  2: Benign.

## 2020-11-30 IMAGING — US US BREAST*L* LIMITED INC AXILLA
1 series · 6 of 6 positions shown · non-contrast
Comparison: Previous exam(s).

CLINICAL DATA: 73-year-old female for further evaluation of
possible LEFT breast mass identified on screening mammogram.

EXAM:
DIGITAL DIAGNOSTIC LEFT MAMMOGRAM WITH TOMO
ULTRASOUND LEFT BREAST

[Series 1: us breast*left* limited inc axilla · 0.06mm/px · 6 of 6 slices shown]
[im 1/6]
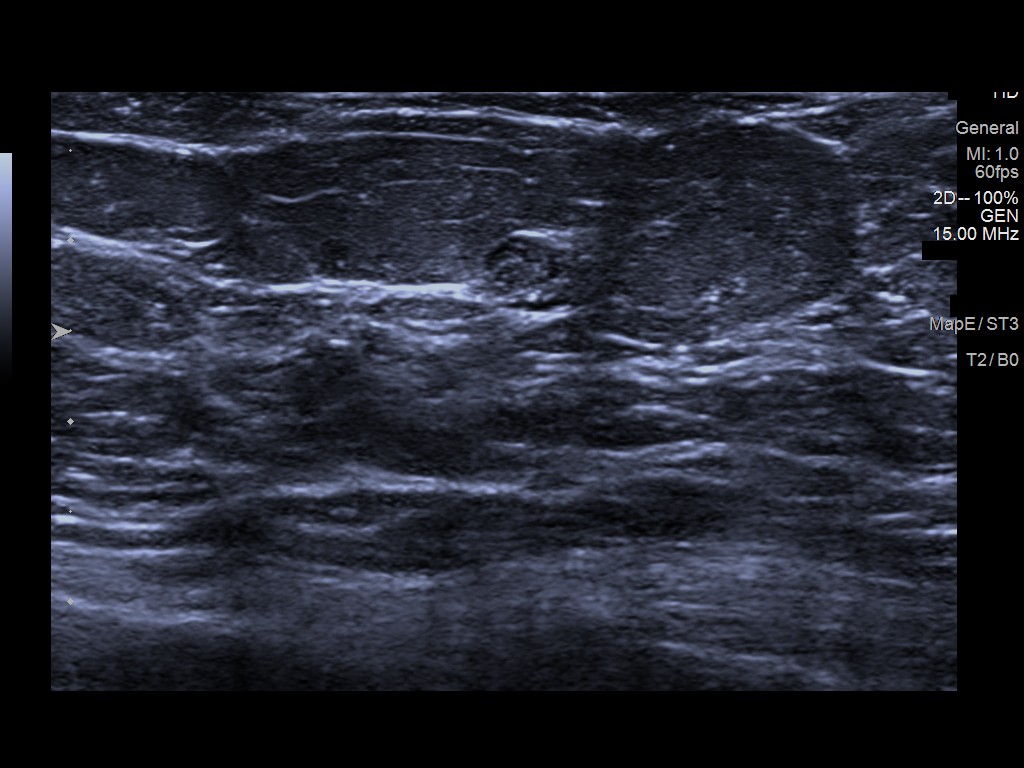
[im 2/6]
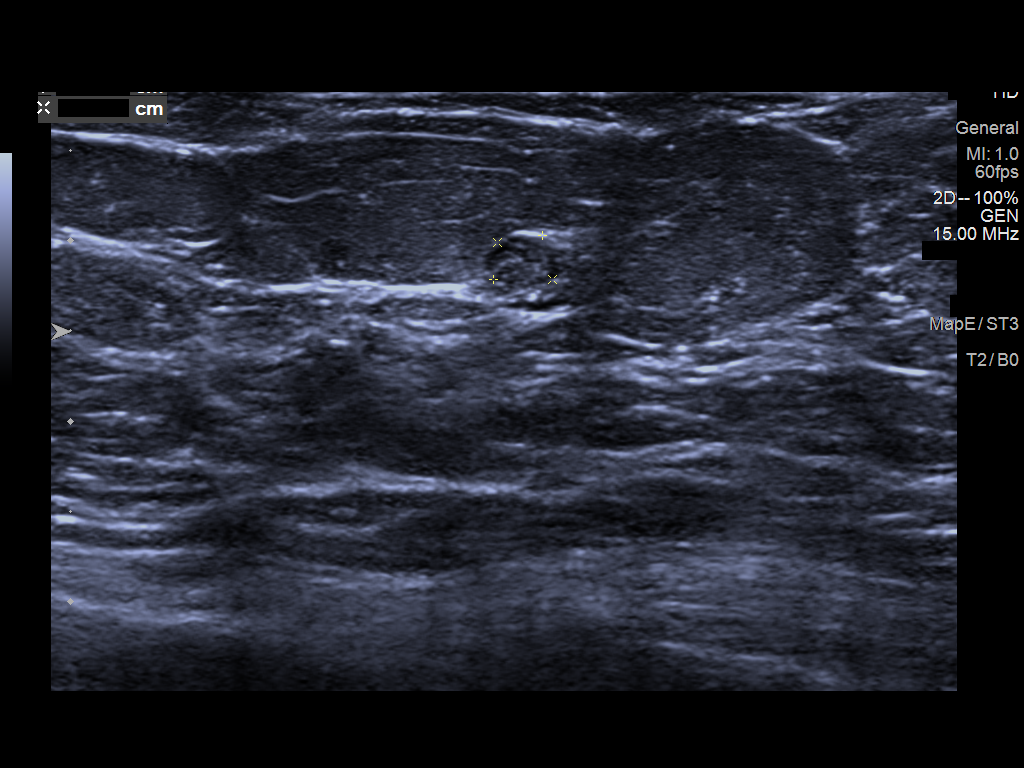
[im 3/6]
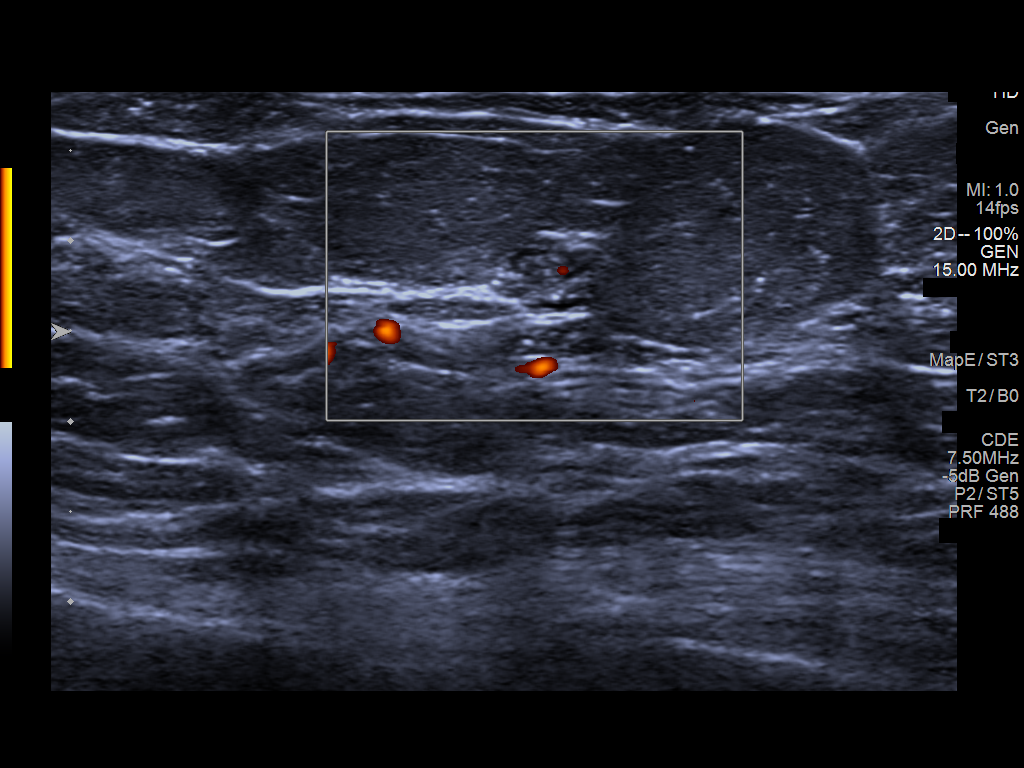
[im 4/6]
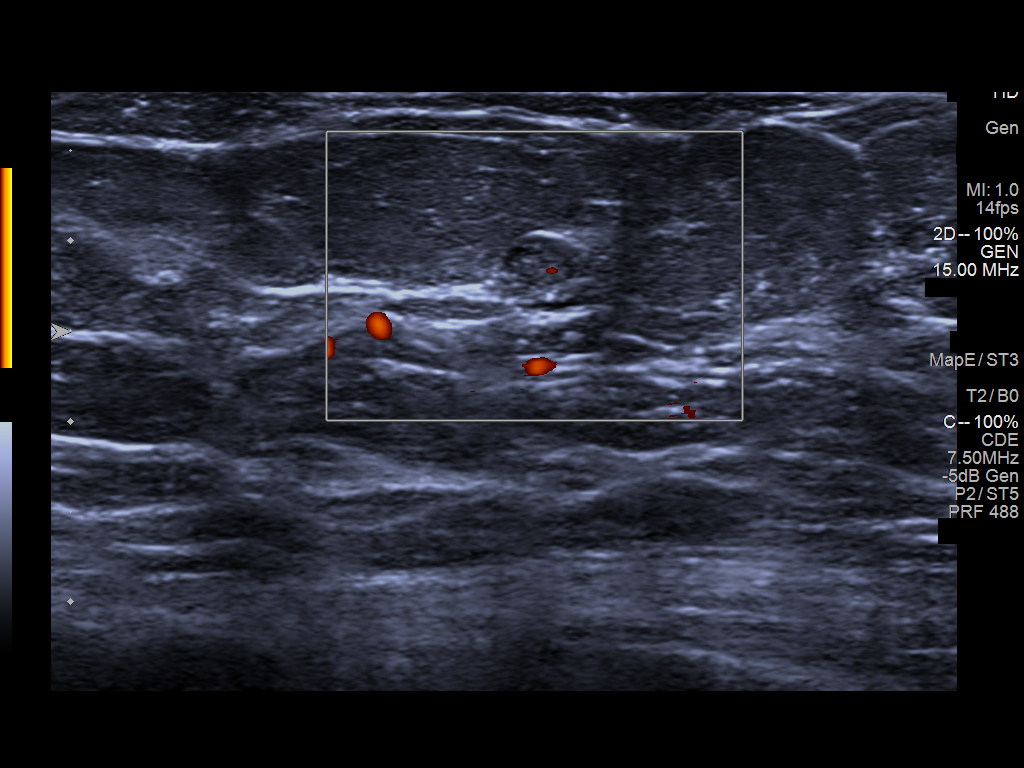
[im 5/6]
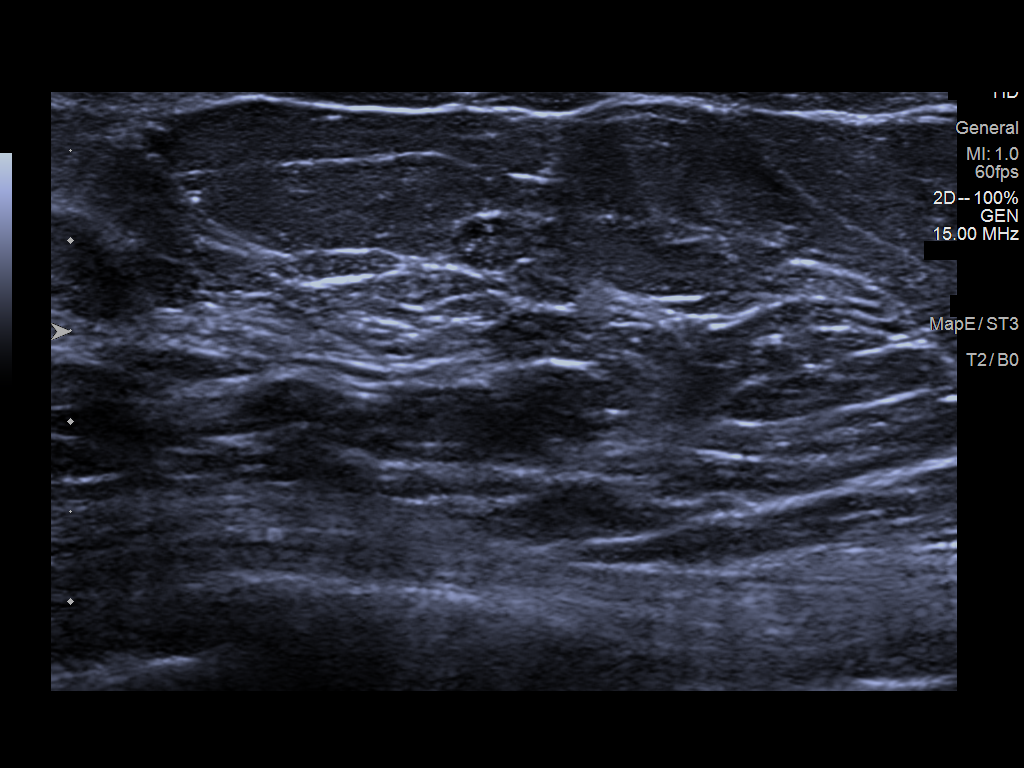
[im 6/6]
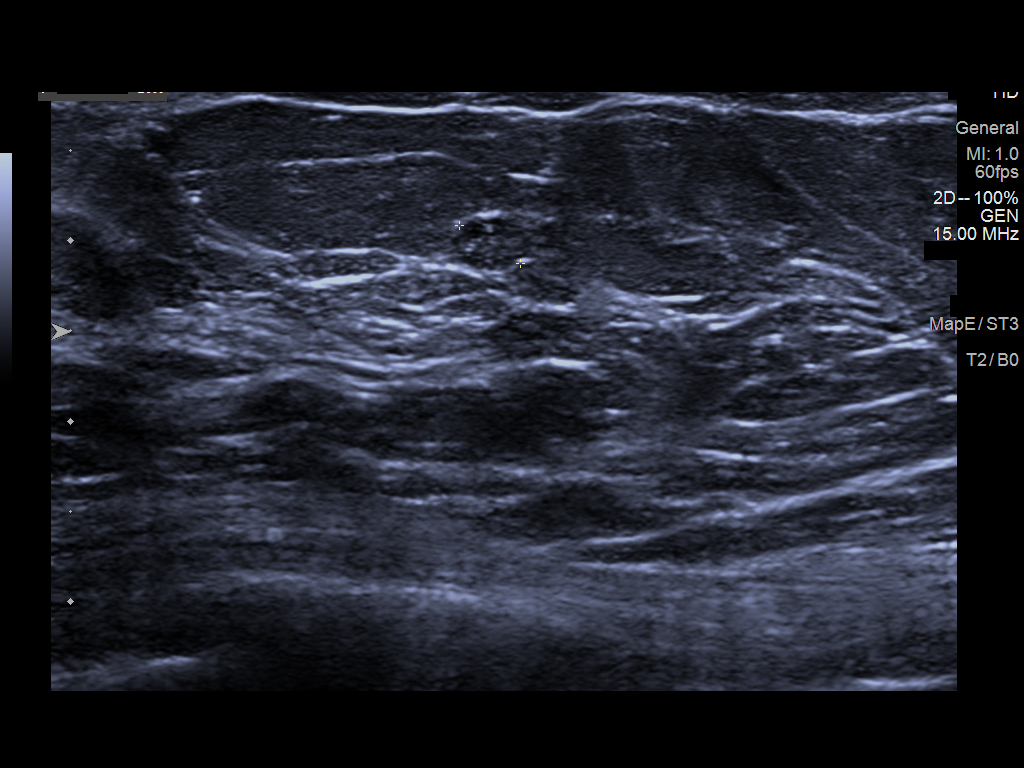

[6 of 6 positions shown; findings below may reference images not displayed]

ACR Breast Density Category b: There are scattered areas of
fibroglandular density.
FINDINGS: 2D/3D spot compression views of the LEFT breast demonstrate a
persistent circumscribed mass in the LOWER RETROAREOLAR LEFT breast,
with a possible fatty hilum.

Targeted ultrasound is performed, showing a 0.4 cm benign
intraparenchymal lymph node at the [DATE] position of the RETROAREOLAR
LEFT breast, corresponding to the mammographic finding.
IMPRESSION: Benign intraparenchymal lymph node in the RETROAREOLAR LEFT breast,
corresponding to the screening study finding.

RECOMMENDATION:
Bilateral screening mammogram in 1 year.

I have discussed the findings and recommendations with the patient.
If applicable, a reminder letter will be sent to the patient
regarding the next appointment.

BI-RADS CATEGORY  2: Benign.

## 2021-05-10 DIAGNOSIS — M25562 Pain in left knee: Secondary | ICD-10-CM | POA: Insufficient documentation

## 2021-05-10 DIAGNOSIS — M25552 Pain in left hip: Secondary | ICD-10-CM | POA: Insufficient documentation

## 2021-05-18 DIAGNOSIS — M1612 Unilateral primary osteoarthritis, left hip: Secondary | ICD-10-CM | POA: Insufficient documentation

## 2021-05-18 DIAGNOSIS — M1712 Unilateral primary osteoarthritis, left knee: Secondary | ICD-10-CM | POA: Insufficient documentation

## 2021-08-21 ENCOUNTER — Other Ambulatory Visit: Payer: Self-pay | Admitting: Internal Medicine

## 2021-08-21 DIAGNOSIS — Z1231 Encounter for screening mammogram for malignant neoplasm of breast: Secondary | ICD-10-CM

## 2021-09-24 ENCOUNTER — Ambulatory Visit
Admission: RE | Admit: 2021-09-24 | Discharge: 2021-09-24 | Disposition: A | Discharge status: Home / Self Care | Payer: Medicare Other | Source: Ambulatory Visit | Attending: Internal Medicine | Admitting: Internal Medicine

## 2021-09-24 DIAGNOSIS — Z1231 Encounter for screening mammogram for malignant neoplasm of breast: Secondary | ICD-10-CM

## 2021-10-12 ENCOUNTER — Encounter: Payer: Self-pay | Admitting: Orthopedic Surgery

## 2021-10-12 ENCOUNTER — Other Ambulatory Visit: Payer: Medicare Other

## 2021-10-12 ENCOUNTER — Encounter
Admission: RE | Admit: 2021-10-12 | Discharge: 2021-10-12 | Disposition: A | Discharge status: Home / Self Care | Payer: Medicare Other | Source: Ambulatory Visit | Attending: Orthopedic Surgery | Admitting: Orthopedic Surgery

## 2021-10-12 ENCOUNTER — Other Ambulatory Visit: Payer: Self-pay

## 2021-10-12 ENCOUNTER — Other Ambulatory Visit: Payer: Self-pay | Admitting: Orthopedic Surgery

## 2021-10-12 VITALS — BP 131/65 | HR 93 | Resp 14 | Ht 68.0 in | Wt 190.0 lb

## 2021-10-12 DIAGNOSIS — I10 Essential (primary) hypertension: Secondary | ICD-10-CM | POA: Insufficient documentation

## 2021-10-12 DIAGNOSIS — Z01818 Encounter for other preprocedural examination: Secondary | ICD-10-CM

## 2021-10-12 DIAGNOSIS — Z01810 Encounter for preprocedural cardiovascular examination: Secondary | ICD-10-CM | POA: Diagnosis not present

## 2021-10-12 LAB — BASIC METABOLIC PANEL
Anion gap: 9 (ref 5–15)
BUN: 20 mg/dL (ref 8–23)
CO2: 27 mmol/L (ref 22–32)
Calcium: 10 mg/dL (ref 8.9–10.3)
Chloride: 104 mmol/L (ref 98–111)
Creatinine, Ser: 0.84 mg/dL (ref 0.44–1.00)
GFR, Estimated: >60 mL/min (ref >60)
Glucose, Bld: 108 mg/dL — ABNORMAL HIGH (ref 70–99)
Potassium: 4 mmol/L (ref 3.5–5.1)
Sodium: 140 mmol/L (ref 135–145)

## 2021-10-12 LAB — CBC
HCT: 40.5 % (ref 36.0–46.0)
Hemoglobin: 13.2 g/dL (ref 12.0–15.0)
MCH: 29.5 pg (ref 26.0–34.0)
MCHC: 32.6 g/dL (ref 30.0–36.0)
MCV: 90.6 fL (ref 80.0–100.0)
Platelets: 316 10*3/uL (ref 150–400)
RBC: 4.47 MIL/uL (ref 3.87–5.11)
RDW: 13.3 % (ref 11.5–15.5)
WBC: 5.3 10*3/uL (ref 4.0–10.5)
nRBC: 0 % (ref 0.0–0.2)

## 2021-10-12 LAB — TYPE AND SCREEN
ABO/RH(D): O POS
Antibody Screen: NEGATIVE

## 2021-10-12 LAB — URINALYSIS, ROUTINE W REFLEX MICROSCOPIC
Bacteria, UA: NONE SEEN
Bilirubin Urine: NEGATIVE
Glucose, UA: NEGATIVE mg/dL
Ketones, ur: NEGATIVE mg/dL
Leukocytes,Ua: NEGATIVE
Nitrite: NEGATIVE
Protein, ur: NEGATIVE mg/dL
Specific Gravity, Urine: 1.017 (ref 1.005–1.030)
pH: 7 (ref 5.0–8.0)

## 2021-10-12 LAB — SURGICAL PCR SCREEN
MRSA, PCR: NEGATIVE
Staphylococcus aureus: NEGATIVE

## 2021-10-12 NOTE — Patient Instructions (Signed)
Your procedure is scheduled on: 10/14/21 Report to DAY SURGERY DEPARTMENT LOCATED ON 2ND FLOOR MEDICAL MALL ENTRANCE. To find out your arrival time please call [phone] between 1PM - 3PM on 10/13/21.  Remember: Instructions that are not followed completely may result in serious medical risk, up to and including death, or upon the discretion of your surgeon and anesthesiologist your surgery may need to be rescheduled.     _X__ 1. Do not eat food or drink any liquids after midnight the night before your procedure.                 No gum chewing or hard candies.   __X__2.  On the morning of surgery brush your teeth with toothpaste and water, you                 may rinse your mouth with mouthwash if you wish.  Do not swallow any              toothpaste of mouthwash.     _X__ 3.  No Alcohol for 24 hours before or after surgery.   _X__ 4.  Do Not Smoke or use e-cigarettes For 24 Hours Prior to Your Surgery.                 Do not use any chewable tobacco products for at least 6 hours prior to                 surgery.  ____  5.  Bring all medications with you on the day of surgery if instructed.   __X__  6.  Notify your doctor if there is any change in your medical condition      (cold, fever, infections).     Do not wear jewelry, make-up, hairpins, clips or nail polish. Do not wear lotions, powders, or perfumes.  Do not shave body hair 48 hours prior to surgery. Men may shave face and neck. Do not bring valuables to the hospital.    [facility] is not responsible for any belongings or valuables.  Contacts, dentures/partials or body piercings may not be worn into surgery. Bring a case for your contacts, glasses or hearing aids, a denture cup will be supplied. Leave your suitcase in the car. After surgery it may be brought to your room. For patients admitted to the hospital, discharge time is determined by your treatment team.   Patients discharged the day of surgery will not be  allowed to drive home.   Please read over the following fact sheets that you were given:   MRSA Information, CHG soap, Incentive Spirometer  __X__ Take these medicines the morning of surgery with A SIP OF WATER:    1. none  2.   3.   4.  5.  6.  ____ Fleet Enema (as directed)   __X__ Use CHG Soap/SAGE wipes as directed  ____ Use inhalers on the day of surgery  ____ Stop metformin/Janumet/Farxiga 2 days prior to surgery    ____ Take 1/2 of usual insulin dose the night before surgery. No insulin the morning          of surgery.   ____ Stop Blood Thinners Coumadin/Plavix/Xarelto/Pleta/Pradaxa/Eliquis/Effient/Aspirin  on   Or contact your Surgeon, Cardiologist or Medical Doctor regarding  ability to stop your blood thinners  __X__ Stop Anti-inflammatories 7 days before surgery such as Advil, Ibuprofen, Motrin,  BC or Goodies Powder, Naprosyn, Naproxen, Aleve, Aspirin    __X__ Stop all herbals  and supplements, fish oil or vitamins  until after surgery.    ____ Bring C-Pap to the hospital.

## 2021-10-12 NOTE — H&P (Signed)
NAME: Jordan Salazar MRN:   [id number] DOB:   20-May-1945     HISTORY AND PHYSICAL  CHIEF COMPLAINT:  left hip pain  HISTORY:   Jordan Gritz Hareis a 76 y.o. female  with left  Hip Pain Patient complains of left hip pain. Onset of the symptoms was several years ago. Inciting event: known DJD. The patient reports the hip pain is worse with weight bearing. Associated symptoms: none. Aggravating symptoms include: any weight bearing. Patient has had no prior hip problems. Previous visits for this problem: multiple, this is a longstanding diagnosis. Last seen several weeks ago by me. Evaluation to date: plain films, which were abnormal  osteoarthritis . Treatment to date: OTC analgesics, which have been somewhat effective, prescription analgesics, which have been somewhat effective, home exercise program, which has been somewhat effective, and physical therapy, which has been somewhat effective.    Plan for left total hip replacement  PAST MEDICAL HISTORY:   Past Medical History:  Diagnosis Date   Hypertension     PAST SURGICAL HISTORY:   Past Surgical History:  Procedure Laterality Date   ABDOMINAL HYSTERECTOMY  2018   BLADDER SURGERY  2018   BREAST CYST EXCISION Right    sebaceous cyst removed   CATARACT EXTRACTION, BILATERAL Bilateral    COLONOSCOPY  2006   Done in [place]   COLONOSCOPY N/A 08/14/2014   Procedure: COLONOSCOPY;  Surgeon: Kieth Brightly, MD;  Location: [facility] ENDOSCOPY;  Service: Endoscopy;  Laterality: N/A;   NASAL SINUS SURGERY  1980   uterian ablation      MEDICATIONS:  (Not in a hospital admission)   ALLERGIES:  No Known Allergies  REVIEW OF SYSTEMS:   Negative except HPI  FAMILY HISTORY:   Family History  Problem Relation Age of Onset   Breast cancer Mother        72   Breast cancer Sister        24    SOCIAL HISTORY:   reports that she has never smoked. She has never used smokeless tobacco. She reports current alcohol use. She reports that she  does not use drugs.  PHYSICAL EXAM:  General appearance: alert, cooperative, and no distress Neck: no JVD and supple, symmetrical, trachea midline Resp: clear to auscultation bilaterally Cardio: regular rate and rhythm, S1, S2 normal, no murmur, click, rub or gallop GI: soft, non-tender; bowel sounds normal; no masses,  no organomegaly Extremities: extremities normal, atraumatic, no cyanosis or edema and Homans sign is negative, no sign of DVT Pulses: 2+ and symmetric    LABORATORY STUDIES: Recent Labs    10/12/21 1513  WBC 5.3  HGB 13.2  HCT 40.5  PLT 316     Recent Labs    10/12/21 1513  NA 140  K 4.0  CL 104  CO2 27  GLUCOSE 108*  BUN 20  CREATININE 0.84  CALCIUM 10.0    STUDIES/RESULTS:  MM 3D SCREEN BREAST BILATERAL  Result Date: 09/24/2021 CLINICAL DATA:  Screening. EXAM: DIGITAL SCREENING BILATERAL MAMMOGRAM WITH TOMOSYNTHESIS AND CAD TECHNIQUE: Bilateral screening digital craniocaudal and mediolateral oblique mammograms were obtained. Bilateral screening digital breast tomosynthesis was performed. The images were evaluated with computer-aided detection. COMPARISON:  Previous exam(s). ACR Breast Density Category b: There are scattered areas of fibroglandular density. FINDINGS: There are no findings suspicious for malignancy. IMPRESSION: No mammographic evidence of malignancy. A result letter of this screening mammogram will be mailed directly to the patient. RECOMMENDATION: Screening mammogram in one year. (Code:[id number]-01Y) BI-RADS  CATEGORY  1: Negative. Electronically Signed   By: Gerome Sam III M.D.   On: 09/24/2021 18:56   ASSESSMENT:  End stage osteoarthritis left hip        Active Problems:   * No active hospital problems. *    PLAN:  Left Primary Total Hip   [name] 10/12/2021. 4:15 PM

## 2021-10-14 ENCOUNTER — Observation Stay
Admission: RE | Admit: 2021-10-14 | Discharge: 2021-10-15 | Disposition: A | Discharge status: Home / Self Care | Payer: Medicare Other | Attending: Orthopedic Surgery | Admitting: Orthopedic Surgery

## 2021-10-14 ENCOUNTER — Ambulatory Visit: Payer: Medicare Other

## 2021-10-14 ENCOUNTER — Ambulatory Visit: Payer: Medicare Other | Admitting: Urgent Care

## 2021-10-14 ENCOUNTER — Other Ambulatory Visit: Payer: Self-pay

## 2021-10-14 ENCOUNTER — Encounter: Payer: Self-pay | Admitting: Orthopedic Surgery

## 2021-10-14 ENCOUNTER — Encounter
Admission: RE | Disposition: A | Discharge status: Home / Self Care | Payer: Self-pay | Source: Home / Self Care | Attending: Orthopedic Surgery

## 2021-10-14 DIAGNOSIS — M1612 Unilateral primary osteoarthritis, left hip: Secondary | ICD-10-CM | POA: Diagnosis present

## 2021-10-14 DIAGNOSIS — Z96642 Presence of left artificial hip joint: Secondary | ICD-10-CM

## 2021-10-14 DIAGNOSIS — I10 Essential (primary) hypertension: Secondary | ICD-10-CM | POA: Insufficient documentation

## 2021-10-14 HISTORY — PX: TOTAL HIP ARTHROPLASTY: SHX124

## 2021-10-14 LAB — ABO/RH: ABO/RH(D): O POS

## 2021-10-14 SURGERY — ARTHROPLASTY, HIP, TOTAL, ANTERIOR APPROACH
Anesthesia: General | Site: Hip | Laterality: Left

## 2021-10-14 MED ORDER — MENTHOL 3 MG MT LOZG: 1.0000 | LOZENGE | OROMUCOSAL | Status: DC | PRN

## 2021-10-14 MED ORDER — ACETAMINOPHEN 10 MG/ML IV SOLN
INTRAVENOUS | Status: DC | PRN
  Administered 2021-10-14: 1000 mg via INTRAVENOUS

## 2021-10-14 MED ORDER — SURGIPHOR WOUND IRRIGATION SYSTEM - OPTIME
TOPICAL | Status: DC | PRN
  Administered 2021-10-14: 450 mL via TOPICAL

## 2021-10-14 MED ORDER — ALUM & MAG HYDROXIDE-SIMETH 200-200-20 MG/5ML PO SUSP: 30.0000 mL | ORAL | Status: DC | PRN

## 2021-10-14 MED ORDER — PHENOL 1.4 % MT LIQD: 1.0000 | OROMUCOSAL | Status: DC | PRN

## 2021-10-14 MED ORDER — KETOROLAC TROMETHAMINE 15 MG/ML IJ SOLN
7.5000 mg | Freq: Four times a day (QID) | INTRAMUSCULAR | Status: AC
  Administered 2021-10-14 – 2021-10-15 (×4): 7.5 mg via INTRAVENOUS
  Filled 2021-10-14 (×4): qty 1

## 2021-10-14 MED ORDER — LACTATED RINGERS IV SOLN: INTRAVENOUS | Status: DC

## 2021-10-14 MED ORDER — CHLORHEXIDINE GLUCONATE 0.12 % MT SOLN: 15.0000 mL | Freq: Once | OROMUCOSAL | Status: AC

## 2021-10-14 MED ORDER — MORPHINE SULFATE (PF) 4 MG/ML IV SOLN: 0.5000 mg | INTRAVENOUS | Status: DC | PRN

## 2021-10-14 MED ORDER — PROPOFOL 10 MG/ML IV BOLUS
INTRAVENOUS | Status: DC | PRN
  Administered 2021-10-14: 30 mg via INTRAVENOUS

## 2021-10-14 MED ORDER — PROPOFOL 500 MG/50ML IV EMUL
INTRAVENOUS | Status: DC | PRN
  Administered 2021-10-14: 60 ug/kg/min via INTRAVENOUS

## 2021-10-14 MED ORDER — BUPIVACAINE HCL (PF) 0.5 % IJ SOLN
INTRAMUSCULAR | Status: AC
  Filled 2021-10-14: qty 10

## 2021-10-14 MED ORDER — MIDAZOLAM HCL 5 MG/5ML IJ SOLN
INTRAMUSCULAR | Status: DC | PRN
  Administered 2021-10-14: 1 mg via INTRAVENOUS

## 2021-10-14 MED ORDER — POVIDONE-IODINE 10 % EX SWAB
2.0000 | Freq: Once | CUTANEOUS | Status: AC
  Administered 2021-10-14: 2 via TOPICAL

## 2021-10-14 MED ORDER — PHENYLEPHRINE HCL-NACL 20-0.9 MG/250ML-% IV SOLN
INTRAVENOUS | Status: DC | PRN
  Administered 2021-10-14: 30 ug/min via INTRAVENOUS

## 2021-10-14 MED ORDER — CALCIUM CARBONATE ANTACID 500 MG PO CHEW
2.0000 | CHEWABLE_TABLET | Freq: Every day | ORAL | Status: DC
  Administered 2021-10-15: 400 mg via ORAL
  Filled 2021-10-14: qty 2

## 2021-10-14 MED ORDER — FENTANYL CITRATE (PF) 100 MCG/2ML IJ SOLN
INTRAMUSCULAR | Status: DC | PRN
  Administered 2021-10-14: 50 ug via INTRAVENOUS

## 2021-10-14 MED ORDER — MIDAZOLAM HCL 2 MG/2ML IJ SOLN
INTRAMUSCULAR | Status: AC
  Filled 2021-10-14: qty 2

## 2021-10-14 MED ORDER — OXYCODONE HCL 5 MG/5ML PO SOLN: 5.0000 mg | Freq: Once | ORAL | Status: AC | PRN

## 2021-10-14 MED ORDER — MAGNESIUM HYDROXIDE 400 MG/5ML PO SUSP: 30.0000 mL | Freq: Every day | ORAL | Status: DC | PRN

## 2021-10-14 MED ORDER — TRANEXAMIC ACID-NACL 1000-0.7 MG/100ML-% IV SOLN
1000.0000 mg | INTRAVENOUS | Status: AC
  Administered 2021-10-14: 1000 mg via INTRAVENOUS

## 2021-10-14 MED ORDER — CEFAZOLIN SODIUM-DEXTROSE 2-4 GM/100ML-% IV SOLN
2.0000 g | Freq: Four times a day (QID) | INTRAVENOUS | Status: AC
  Administered 2021-10-14 (×2): 2 g via INTRAVENOUS
  Filled 2021-10-14 (×2): qty 100

## 2021-10-14 MED ORDER — ORAL CARE MOUTH RINSE: 15.0000 mL | Freq: Once | OROMUCOSAL | Status: AC

## 2021-10-14 MED ORDER — DOCUSATE SODIUM 100 MG PO CAPS
100.0000 mg | ORAL_CAPSULE | Freq: Two times a day (BID) | ORAL | Status: DC
  Administered 2021-10-14 – 2021-10-15 (×3): 100 mg via ORAL
  Filled 2021-10-14 (×3): qty 1

## 2021-10-14 MED ORDER — ASPIRIN 81 MG PO CHEW
81.0000 mg | CHEWABLE_TABLET | Freq: Two times a day (BID) | ORAL | Status: DC
  Administered 2021-10-14 – 2021-10-15 (×2): 81 mg via ORAL
  Filled 2021-10-14 (×2): qty 1

## 2021-10-14 MED ORDER — METOCLOPRAMIDE HCL 5 MG/ML IJ SOLN: 5.0000 mg | Freq: Three times a day (TID) | INTRAMUSCULAR | Status: DC | PRN

## 2021-10-14 MED ORDER — FAMOTIDINE 20 MG PO TABS: 20.0000 mg | ORAL_TABLET | Freq: Once | ORAL | Status: AC

## 2021-10-14 MED ORDER — ACETAMINOPHEN 325 MG PO TABS: 325.0000 mg | ORAL_TABLET | Freq: Four times a day (QID) | ORAL | Status: DC | PRN

## 2021-10-14 MED ORDER — CHLORHEXIDINE GLUCONATE 0.12 % MT SOLN
OROMUCOSAL | Status: AC
  Administered 2021-10-14: 15 mL via OROMUCOSAL
  Filled 2021-10-14: qty 15

## 2021-10-14 MED ORDER — CEFAZOLIN SODIUM-DEXTROSE 2-4 GM/100ML-% IV SOLN
INTRAVENOUS | Status: AC
  Filled 2021-10-14: qty 100

## 2021-10-14 MED ORDER — PROPOFOL 1000 MG/100ML IV EMUL
INTRAVENOUS | Status: AC
  Filled 2021-10-14: qty 100

## 2021-10-14 MED ORDER — METOCLOPRAMIDE HCL 5 MG PO TABS: 5.0000 mg | ORAL_TABLET | Freq: Three times a day (TID) | ORAL | Status: DC | PRN

## 2021-10-14 MED ORDER — BUPIVACAINE-EPINEPHRINE (PF) 0.25% -1:200000 IJ SOLN
INTRAMUSCULAR | Status: DC | PRN
  Administered 2021-10-14: 30 mL

## 2021-10-14 MED ORDER — OXYCODONE HCL 5 MG PO TABS
ORAL_TABLET | ORAL | Status: AC
  Filled 2021-10-14: qty 1

## 2021-10-14 MED ORDER — MAGNESIUM OXIDE -MG SUPPLEMENT 400 (240 MG) MG PO TABS
400.0000 mg | ORAL_TABLET | Freq: Every day | ORAL | Status: DC
  Administered 2021-10-14 – 2021-10-15 (×2): 400 mg via ORAL
  Filled 2021-10-14 (×2): qty 1

## 2021-10-14 MED ORDER — GABAPENTIN 300 MG PO CAPS
300.0000 mg | ORAL_CAPSULE | Freq: Every day | ORAL | Status: DC
  Administered 2021-10-14: 300 mg via ORAL
  Filled 2021-10-14: qty 1

## 2021-10-14 MED ORDER — BISACODYL 10 MG RE SUPP: 10.0000 mg | Freq: Every day | RECTAL | Status: DC | PRN

## 2021-10-14 MED ORDER — HYDROCODONE-ACETAMINOPHEN 5-325 MG PO TABS: 1.0000 | ORAL_TABLET | ORAL | Status: DC | PRN

## 2021-10-14 MED ORDER — FAMOTIDINE 20 MG PO TABS
ORAL_TABLET | ORAL | Status: AC
  Administered 2021-10-14: 20 mg
  Filled 2021-10-14: qty 1

## 2021-10-14 MED ORDER — FENTANYL CITRATE (PF) 100 MCG/2ML IJ SOLN: 25.0000 ug | INTRAMUSCULAR | Status: DC | PRN

## 2021-10-14 MED ORDER — ONDANSETRON HCL 4 MG/2ML IJ SOLN: 4.0000 mg | Freq: Four times a day (QID) | INTRAMUSCULAR | Status: DC | PRN

## 2021-10-14 MED ORDER — OXYCODONE HCL 5 MG PO TABS
5.0000 mg | ORAL_TABLET | Freq: Once | ORAL | Status: AC | PRN
  Administered 2021-10-14: 5 mg via ORAL

## 2021-10-14 MED ORDER — HYDROCODONE-ACETAMINOPHEN 7.5-325 MG PO TABS
1.0000 | ORAL_TABLET | ORAL | Status: DC | PRN
  Administered 2021-10-14 – 2021-10-15 (×4): 1 via ORAL
  Filled 2021-10-14 (×4): qty 1

## 2021-10-14 MED ORDER — ONDANSETRON HCL 4 MG PO TABS: 4.0000 mg | ORAL_TABLET | Freq: Four times a day (QID) | ORAL | Status: DC | PRN

## 2021-10-14 MED ORDER — PHENYLEPHRINE HCL-NACL 20-0.9 MG/250ML-% IV SOLN
INTRAVENOUS | Status: AC
Start: 2021-10-14 — End: ?
  Filled 2021-10-14: qty 250

## 2021-10-14 MED ORDER — FENTANYL CITRATE (PF) 100 MCG/2ML IJ SOLN
INTRAMUSCULAR | Status: AC
  Filled 2021-10-14: qty 2

## 2021-10-14 MED ORDER — 0.9 % SODIUM CHLORIDE (POUR BTL) OPTIME
TOPICAL | Status: DC | PRN
  Administered 2021-10-14: 500 mL

## 2021-10-14 MED ORDER — SODIUM CHLORIDE 0.9 % IR SOLN
Status: DC | PRN
  Administered 2021-10-14: 3000 mL

## 2021-10-14 MED ORDER — TRANEXAMIC ACID-NACL 1000-0.7 MG/100ML-% IV SOLN
INTRAVENOUS | Status: AC
  Filled 2021-10-14: qty 100

## 2021-10-14 MED ORDER — BUPIVACAINE HCL (PF) 0.5 % IJ SOLN
INTRAMUSCULAR | Status: DC | PRN
  Administered 2021-10-14: 2.5 mL

## 2021-10-14 MED ORDER — CEFAZOLIN SODIUM-DEXTROSE 2-4 GM/100ML-% IV SOLN
2.0000 g | INTRAVENOUS | Status: AC
  Administered 2021-10-14: 2 g via INTRAVENOUS

## 2021-10-14 MED ORDER — ACETAMINOPHEN 10 MG/ML IV SOLN
INTRAVENOUS | Status: AC
  Filled 2021-10-14: qty 100

## 2021-10-14 MED ORDER — ONDANSETRON HCL 4 MG/2ML IJ SOLN
4.0000 mg | Freq: Once | INTRAMUSCULAR | Status: DC | PRN
Start: 2021-10-14 — End: 2021-10-14

## 2021-10-14 MED ORDER — RAMIPRIL 2.5 MG PO CAPS
2.5000 mg | ORAL_CAPSULE | Freq: Every day | ORAL | Status: DC
  Administered 2021-10-14 – 2021-10-15 (×2): 2.5 mg via ORAL
  Filled 2021-10-14 (×2): qty 1

## 2021-10-14 MED ORDER — ACETAMINOPHEN 10 MG/ML IV SOLN: 1000.0000 mg | Freq: Once | INTRAVENOUS | Status: DC | PRN

## 2021-10-14 MED ORDER — BUPIVACAINE-EPINEPHRINE (PF) 0.25% -1:200000 IJ SOLN
INTRAMUSCULAR | Status: AC
  Filled 2021-10-14: qty 30

## 2021-10-14 SURGICAL SUPPLY — 57 items
BLADE SAGITTAL WIDE XTHICK NO (BLADE) ×2 IMPLANT
BNDG COHESIVE 4X5 TAN ST LF (GAUZE/BANDAGES/DRESSINGS) ×4 IMPLANT
BRUSH SCRUB EZ  4% CHG (MISCELLANEOUS) ×1
BRUSH SCRUB EZ 4% CHG (MISCELLANEOUS) ×1 IMPLANT
CHLORAPREP W/TINT 26 (MISCELLANEOUS) ×2 IMPLANT
COVER HOLE (Hips) ×1 IMPLANT
CUP R3 52MM (Hips) ×1 IMPLANT
DRAPE 3/4 80X56 (DRAPES) ×2 IMPLANT
DRAPE C-ARM 42X72 X-RAY (DRAPES) ×2 IMPLANT
DRAPE STERI IOBAN 125X83 (DRAPES) IMPLANT
DRAPE U-SHAPE 47X51 STRL (DRAPES) ×2 IMPLANT
DRSG AQUACEL AG ADV 3.5X10 (GAUZE/BANDAGES/DRESSINGS) ×1 IMPLANT
DRSG AQUACEL AG ADV 3.5X14 (GAUZE/BANDAGES/DRESSINGS) IMPLANT
ELECT REM PT RETURN 9FT ADLT (ELECTROSURGICAL) ×2
ELECTRODE REM PT RTRN 9FT ADLT (ELECTROSURGICAL) ×1 IMPLANT
GAUZE 4X4 16PLY ~~LOC~~+RFID DBL (SPONGE) ×2 IMPLANT
GAUZE XEROFORM 1X8 LF (GAUZE/BANDAGES/DRESSINGS) IMPLANT
GLOVE BIO SURGEON STRL SZ8 (GLOVE) ×2 IMPLANT
GLOVE BIOGEL PI IND STRL 8.5 (GLOVE) ×2 IMPLANT
GLOVE BIOGEL PI INDICATOR 8.5 (GLOVE) ×2
GLOVE SURG ORTHO 8.5 STRL (GLOVE) ×2 IMPLANT
GOWN STRL REUS W/ TWL XL LVL3 (GOWN DISPOSABLE) ×2 IMPLANT
GOWN STRL REUS W/TWL XL LVL3 (GOWN DISPOSABLE) ×2
HEAD FEM KNEE TAPER 36MM XS-3 (Head) ×1 IMPLANT
HOOD PEEL AWAY FLYTE STAYCOOL (MISCELLANEOUS) ×6 IMPLANT
IV NS 250ML (IV SOLUTION)
IV NS 250ML BAXH (IV SOLUTION) IMPLANT
IV NS IRRIG 3000ML ARTHROMATIC (IV SOLUTION) ×2 IMPLANT
KIT PATIENT CARE HANA TABLE (KITS) ×2 IMPLANT
KIT TURNOVER CYSTO (KITS) ×2 IMPLANT
LINER ACETAB 0 DEG (Liner) ×1 IMPLANT
MANIFOLD NEPTUNE II (INSTRUMENTS) ×2 IMPLANT
MAT ABSORB  FLUID 56X50 GRAY (MISCELLANEOUS) ×1
MAT ABSORB FLUID 56X50 GRAY (MISCELLANEOUS) ×1 IMPLANT
NDL SAFETY ECLIPSE 18X1.5 (NEEDLE) IMPLANT
NDL SPNL 20GX3.5 QUINCKE YW (NEEDLE) ×1 IMPLANT
NEEDLE HYPO 18GX1.5 SHARP (NEEDLE)
NEEDLE HYPO 22GX1.5 SAFETY (NEEDLE) ×2 IMPLANT
NEEDLE SPNL 20GX3.5 QUINCKE YW (NEEDLE) ×2 IMPLANT
PACK HIP PROSTHESIS (MISCELLANEOUS) ×2 IMPLANT
PADDING CAST BLEND 4X4 NS (MISCELLANEOUS) ×4 IMPLANT
PENCIL ELECTRO HAND CTR (MISCELLANEOUS) ×2 IMPLANT
PILLOW ABDUCTION MEDIUM (MISCELLANEOUS) ×2 IMPLANT
PULSAVAC PLUS IRRIG FAN TIP (DISPOSABLE) ×2
SCREW 6.5X25MM (Screw) ×1 IMPLANT
SOLUTION IRRIG SURGIPHOR (IV SOLUTION) ×2 IMPLANT
SPONGE T-LAP 18X18 ~~LOC~~+RFID (SPONGE) ×8 IMPLANT
STAPLER SKIN PROX 35W (STAPLE) ×2 IMPLANT
STEM POLAR STD SZ4 COLLAR (Stent) ×1 IMPLANT
SUT BONE WAX W31G (SUTURE) ×2 IMPLANT
SUT DVC 2 QUILL PDO  T11 36X36 (SUTURE) ×1
SUT DVC 2 QUILL PDO T11 36X36 (SUTURE) ×1 IMPLANT
SUT VIC AB 2-0 CT1 18 (SUTURE) ×2 IMPLANT
SYR 20ML LL LF (SYRINGE) ×2 IMPLANT
TIP FAN IRRIG PULSAVAC PLUS (DISPOSABLE) ×1 IMPLANT
WAND WEREWOLF FASTSEAL 6.0 (MISCELLANEOUS) ×2 IMPLANT
WATER STERILE IRR 500ML POUR (IV SOLUTION) ×2 IMPLANT

## 2021-10-14 NOTE — H&P (Signed)
The patient has been re-examined, and the chart reviewed, and there have been no interval changes to the documented history and physical.  Plan a left total hip today.  Anesthesia is not consulted regarding a peripheral nerve block for post-operative pain.  The risks, benefits, and alternatives have been discussed at length, and the patient is willing to proceed.    

## 2021-10-14 NOTE — Transfer of Care (Signed)
Immediate Anesthesia Transfer of Care Note  Patient: Jordan Salazar  Procedure(s) Performed: TOTAL HIP ARTHROPLASTY ANTERIOR APPROACH (Left: Hip)  Patient Location: PACU  Anesthesia Type:General and Spinal  Level of Consciousness: awake, alert  and oriented  Airway & Oxygen Therapy: Patient Spontanous Breathing and Patient connected to face mask oxygen  Post-op Assessment: Report given to RN and Post -op Vital signs reviewed and stable  Post vital signs: Reviewed and stable  Last Vitals:  Vitals Value Taken Time  BP 86/46 10/14/21 0950  Temp    Pulse 87 10/14/21 0951  Resp 10 10/14/21 0951  SpO2 96 % 10/14/21 0951  Vitals shown include unvalidated device data.  Last Pain:  Vitals:   10/14/21 0616  TempSrc: Oral  PainSc: 3          Complications: No notable events documented.

## 2021-10-14 NOTE — Plan of Care (Signed)
  Problem: Activity: Goal: Risk for activity intolerance will decrease Outcome: Progressing   Problem: Pain Managment: Goal: General experience of comfort will improve Outcome: Progressing   

## 2021-10-14 NOTE — Anesthesia Preprocedure Evaluation (Addendum)
Anesthesia Evaluation  Patient identified by MRN, date of birth, ID band Patient awake    Reviewed: Allergy & Precautions, NPO status , Patient's Chart, lab work & pertinent test results  History of Anesthesia Complications Negative for: history of anesthetic complications  Airway Mallampati: III   Neck ROM: Full    Dental no notable dental hx.    Pulmonary neg pulmonary ROS,    Pulmonary exam normal breath sounds clear to auscultation       Cardiovascular hypertension, Normal cardiovascular exam Rhythm:Regular Rate:Normal  ECG 10/12/21: normal   Neuro/Psych negative neurological ROS     GI/Hepatic negative GI ROS,   Endo/Other  negative endocrine ROS  Renal/GU negative Renal ROS     Musculoskeletal   Abdominal   Peds  Hematology negative hematology ROS (+)   Anesthesia Other Findings   Reproductive/Obstetrics                            Anesthesia Physical Anesthesia Plan  ASA: 2  Anesthesia Plan: General and Spinal   Post-op Pain Management:    Induction: Intravenous  PONV Risk Score and Plan: 3 and Propofol infusion, TIVA, Treatment may vary due to age or medical condition and Ondansetron  Airway Management Planned: Natural Airway and Nasal Cannula  Additional Equipment:   Intra-op Plan:   Post-operative Plan:   Informed Consent: I have reviewed the patients History and Physical, chart, labs and discussed the procedure including the risks, benefits and alternatives for the proposed anesthesia with the patient or authorized representative who has indicated his/her understanding and acceptance.       Plan Discussed with: CRNA  Anesthesia Plan Comments: (Plan for spinal and GA with natural airway, LMA/GETA backup.  Patient consented for risks of anesthesia including but not limited to:  - adverse reactions to medications - damage to eyes, teeth, lips or other oral  mucosa - nerve damage due to positioning  - sore throat or hoarseness - headache, bleeding, infection, nerve damage 2/2 spinal - damage to heart, brain, nerves, lungs, other parts of body or loss of life  Informed patient about role of CRNA in peri- and intra-operative care.  Patient voiced understanding.)        Anesthesia Quick Evaluation

## 2021-10-14 NOTE — Progress Notes (Signed)
Physical Therapy Evaluation Patient Details Name: Jordan Salazar MRN: [id number] DOB: September 06, 1945 Today's Date: 10/14/2021  History of Present Illness  Pt is a 76 y.o. female who underwent L anterior approach THA on 10/14/21. PMH includes HTN. POD 0 at PT evaluation.   Clinical Impression  Pt is a pleasant 76 year old female who was admitted for L anterior THA on 10/14/21. Pt received sitting in recliner with LEs elevated and agreeable to PT. Pt educated on precautions and HEP booklet with pt demonstrating therapeutic exercises seated in recliner with LEs elevated. Pt unable to complete heel slides or SLR against gravity while sitting up in chair. Pt reported pain at 7/10 at start of session with no increase during session and reported feeling better after ambulation. Pt performed sit-to-stand and step pivot transfers with CGA and RW. Upon standing, pt able to take a few steps in place with good WB through L LE. Pt ambulated 120 feet with RW and CGA with step-through gait pattern and slight antalgic gait. Upon return to room, pt utilized [facility] for bowel movement and was independent with pericare. Pt performed sit to supine transfer with Min A required for L LE management with pt unable to fully lift L LE against gravity. Would benefit from skilled PT to address above deficits and promote optimal return to PLOF. Recommend outpatient PT at discharge based on pt mobility status and help from husband at home.     Recommendations for follow up therapy are one component of a multi-disciplinary discharge planning process, led by the attending physician.  Recommendations may be updated based on patient status, additional functional criteria and insurance authorization.  Follow Up Recommendations Outpatient PT      Assistance Recommended at Discharge PRN  Patient can return home with the following  A little help with walking and/or transfers;A little help with bathing/dressing/bathroom;Assistance with  cooking/housework;Assist for transportation;Help with stairs or ramp for entrance    Equipment Recommendations Rolling walker (2 wheels)  Recommendations for Other Services       Functional Status Assessment Patient has had a recent decline in their functional status and demonstrates the ability to make significant improvements in function in a reasonable and predictable amount of time.     Precautions / Restrictions Precautions Precautions: Anterior Hip;Fall Precaution Booklet Issued: Yes (comment) Restrictions Weight Bearing Restrictions: Yes LLE Weight Bearing: Weight bearing as tolerated      Mobility  Bed Mobility Overal bed mobility: Needs Assistance Bed Mobility: Sit to Supine       Sit to supine: Min assist   General bed mobility comments: Min A for L LE management for sit to supine due to pt unable to lift L LE against gravity    Transfers Overall transfer level: Needs assistance Equipment used: Rolling walker (2 wheels) Transfers: Sit to/from Stand, Bed to chair/wheelchair/BSC Sit to Stand: Min guard   Step pivot transfers: Min guard       General transfer comment: CGA with RW for sit-to-stand transfer from chair and BSC, and step pivot transfer from [facility] to bed.    Ambulation/Gait Ambulation/Gait assistance: Min guard Gait Distance (Feet): 120 Feet Assistive device: Rolling walker (2 wheels) Gait Pattern/deviations: Step-through pattern, Antalgic, Decreased stance time - left, Decreased step length - right Gait velocity: decreased   Pre-gait activities: Weight shifting and steps in place with RW to assess WB tolerance through L LE prior to ambulation. x4 ea side. General Gait Details: Ambulated with slow, steady, continuous step-through gait pattern with  slight antalgic gait offloading L LE with UE support on RW and CGA.  Stairs            Wheelchair Mobility    Modified Rankin (Stroke Patients Only)       Balance Overall balance  assessment: Needs assistance Sitting-balance support: No upper extremity supported, Feet supported Sitting balance-Leahy Scale: Good Sitting balance - Comments: Pt able to sit in chair, on BSC, on EOB with back unsupported, feet supported, no UE support and no LOB   Standing balance support: Bilateral upper extremity supported, During functional activity Standing balance-Leahy Scale: Good Standing balance comment: Pt static standing with 1 UE on RW without LOB. Dynamic balance during ambulation required bilat UE support on RW for balance.                             Pertinent Vitals/Pain Pain Assessment Pain Assessment: 0-10 Pain Score: 7  Pain Location: L hip Pain Descriptors / Indicators: Throbbing, Operative site guarding, Discomfort Pain Intervention(s): Limited activity within patient's tolerance, Monitored during session, Premedicated before session, Repositioned    Home Living Family/patient expects to be discharged to:: Private residence Living Arrangements: Spouse/significant other Available Help at Discharge: Family;Available 24 hours/day Type of Home: House Home Access: Stairs to enter Entrance Stairs-Rails: None Entrance Stairs-Number of Steps: 2   Home Layout: Able to live on main level with bedroom/bathroom;Two level Home Equipment: Rollator (4 wheels);Cane - single point;Shower seat      Prior Function Prior Level of Function : Independent/Modified Independent;Driving;History of Falls (last six months)             Mobility Comments: Pt ambulates without AD, retired, driving. Reported 1 fall from curb onto dirt in May 2023 with no injuries, but required assistance from husband and stranger to get up. ADLs Comments: independent with bathing, dressing, feeding herself     Hand Dominance   Dominant Hand: Right    Extremity/Trunk Assessment   Upper Extremity Assessment Upper Extremity Assessment: Overall WFL for tasks assessed    Lower  Extremity Assessment Lower Extremity Assessment: LLE deficits/detail LLE Deficits / Details: L THA POD 0, unable to lift L LE via SLR against gravity LLE: Unable to fully assess due to pain    Cervical / Trunk Assessment Cervical / Trunk Assessment: Normal  Communication   Communication: No difficulties  Cognition Arousal/Alertness: Awake/alert Behavior During Therapy: WFL for tasks assessed/performed Overall Cognitive Status: Within Functional Limits for tasks assessed                                 General Comments: A&Ox4        General Comments      Exercises Total Joint Exercises Ankle Circles/Pumps: AROM, Both, 10 reps, Seated Quad Sets: AROM, Strengthening, Left, 10 reps, Other (comment) (seated in recliner with LEs elevated) Gluteal Sets: AROM, Strengthening, Left, 10 reps, Seated Towel Squeeze: AROM, Strengthening, Both, 10 reps, Other (comment) (seated in recliner with LEs elevated) Short Arc Quad: AROM, Strengthening, Left, 10 reps, Other (comment) (seated in recliner with LEs elevated) Heel Slides: Other (comment) (Attempted with pt unable to complete heel slides while seated in recliner with LEs elevated) Straight Leg Raises: Other (comment) (Attempted with pt unable to complete SLR against gravity at this time while seated in recliner with bilat LEs elevated) Other Exercises Other Exercises: Following ambulation, pt transferred to [facility] for  BM. Pt independent with pericare prior to step pivot transfer back to bed at end of session.   Assessment/Plan    PT Assessment Patient needs continued PT services  PT Problem List Decreased strength;Decreased range of motion;Decreased activity tolerance;Decreased mobility;Decreased balance;Decreased knowledge of use of DME;Decreased knowledge of precautions;Pain       PT Treatment Interventions DME instruction;Gait training;Stair training;Functional mobility training;Therapeutic activities;Therapeutic  exercise;Balance training;Patient/family education    PT Goals (Current goals can be found in the Care Plan section)  Acute Rehab PT Goals Patient Stated Goal: to go home PT Goal Formulation: With patient Time For Goal Achievement: 10/28/21 Potential to Achieve Goals: Good    Frequency BID     Co-evaluation               AM-PAC PT "6 Clicks" Mobility  Outcome Measure Help needed turning from your back to your side while in a flat bed without using bedrails?: A Little Help needed moving from lying on your back to sitting on the side of a flat bed without using bedrails?: A Little Help needed moving to and from a bed to a chair (including a wheelchair)?: A Little Help needed standing up from a chair using your arms (e.g., wheelchair or bedside chair)?: A Little Help needed to walk in hospital room?: A Little Help needed climbing 3-5 steps with a railing? : A Lot 6 Click Score: 17    End of Session Equipment Utilized During Treatment: Gait belt Activity Tolerance: Patient tolerated treatment well Patient left: in bed;with call bell/phone within reach;with bed alarm set;with SCD's reapplied Nurse Communication: Mobility status;Precautions PT Visit Diagnosis: Other abnormalities of gait and mobility (R26.89);Muscle weakness (generalized) (M62.81);Pain Pain - Right/Left: Left Pain - part of body: Hip    Time: 1610-9604 PT Time Calculation (min) (ACUTE ONLY): 41 min   Charges:              Radford Pax, SPT  Nicholaos Schippers 10/14/2021, 4:01 PM

## 2021-10-14 NOTE — Anesthesia Postprocedure Evaluation (Signed)
Anesthesia Post Note  Patient: Apollonia Amini  Procedure(s) Performed: TOTAL HIP ARTHROPLASTY ANTERIOR APPROACH (Left: Hip)  Patient location during evaluation: PACU Anesthesia Type: Spinal Level of consciousness: awake and alert, oriented and patient cooperative Pain management: pain level controlled Vital Signs Assessment: post-procedure vital signs reviewed and stable Respiratory status: spontaneous breathing, nonlabored ventilation and respiratory function stable Cardiovascular status: blood pressure returned to baseline and stable Postop Assessment: adequate PO intake, no headache and spinal receding Anesthetic complications: no   No notable events documented.   Last Vitals:  Vitals:   10/14/21 1030 10/14/21 1049  BP: 121/71 114/69  Pulse: 74 72  Resp: 20 16  Temp: (!) 36.4 C 36.5 C  SpO2: 100% 100%    Last Pain:  Vitals:   10/14/21 1330  TempSrc:   PainSc: 6                  Reed Breech

## 2021-10-14 NOTE — Op Note (Signed)
10/14/2021  9:51 AM  PATIENT:  Kaylena Pacifico   MRN: [id number]  PRE-OPERATIVE DIAGNOSIS:  Osteoarthritis left hip   POST-OPERATIVE DIAGNOSIS: Same  Procedure: Left Total Hip Replacement  Surgeon: Dola Argyle. Odis Luster, MD   Assist: [name], PA-C  Anesthesia: Spinal   EBL: 100 mL   Specimens: None   Drains: None   Components used: A size 4 Polarstem [employer], R3 size 52 mm shell, and a 36 mm -3 mm head    Description of the procedure in detail: After informed consent was obtained and the appropriate extremity marked in the pre-operative holding area, the patient was taken to the operating room and placed in the supine position on the fracture table. All pressure points were well padded and bilateral lower extremities were place in traction spars. The hip was prepped and draped in standard sterile fashion. A spinal anesthetic had been delivered by the anesthesia team. The skin and subcutaneous tissues were injected with a mixture of Marcaine with epinephrine for post-operative pain. A longitudinal incision approximately 10 cm in length was carried out from the anterior superior iliac spine to the greater trochanter. The tensor fascia was divided and blunt dissection was taken down to the level of the joint capsule. The lateral circumflex vessels were cauterized. Deep retractors were placed and a portion of the anterior capsule was excised. Using fluoroscopy the neck cut was planned and carried out with a sagittal saw. The head was passed from the field with use of a corkscrew and hip skid. Deep retractors were placed along the acetabulum and the degenerative labrum and large osteophytes were removed with a Rongeur. The cup was sequentially reamed to a size 52 mm. The wound was irrigated and using fluoroscopy the size 52 mm cup was impacted in to anatomic position. A single screw was placed followed by a threaded hole cover. The final liner was impacted in to position. Attention  was then turned to the proximal femur. The leg was placed in extension and external rotation. The canal was opened and sequentially broached to a size 4. The trial components were placed and the hip relocated. The components were found to be in good position using fluoroscopy. The hip was dislocated and the trial components removed. The final components were impacted in to position and the hip relocated. The final components were again check with fluoroscopy and found to be in good position. Hemostasis was achieved with electrocautery. The deep capsule was injected with Marcaine and epinephrine. The wound was irrigated with bacitracin laced normal saline and the tensor fascia closed with #2 Quill suture. The subcutaneous tissues were closed with 2-0 vicryl and staples for the skin. A sterile dressing was applied and an abduction pillow. Patient tolerated the procedure well and there were no apparent complication. Patient was taken to the recovery room in good condition.   Cassell Smiles, MD

## 2021-10-14 NOTE — Anesthesia Procedure Notes (Signed)
Spinal  Patient location during procedure: OR Start time: 10/14/2021 7:35 AM End time: 10/14/2021 7:55 AM Reason for block: surgical anesthesia Staffing Performed: anesthesiologist and resident/CRNA  Anesthesiologist: Reed Breech, MD Resident/CRNA: Katherine Basset, CRNA Performed by: Katherine Basset, CRNA Authorized by: Reed Breech, MD   Preanesthetic Checklist Completed: patient identified, IV checked, site marked, risks and benefits discussed, surgical consent, monitors and equipment checked, pre-op evaluation and timeout performed Spinal Block Patient position: sitting Prep: DuraPrep Patient monitoring: heart rate, cardiac monitor, continuous pulse ox and blood pressure Approach: right paramedian Location: L2-3 Injection technique: single-shot Needle Needle type: Quincke  Needle gauge: 22 G Needle length: 9 cm Assessment Sensory level: T4 Events: CSF return Additional Notes 2 attempts by CRNA Chilton Si, successful 3rd attempt by Dr. Ronni Rumble, Pt tolerated procedure throughout

## 2021-10-15 DIAGNOSIS — M1612 Unilateral primary osteoarthritis, left hip: Secondary | ICD-10-CM | POA: Diagnosis not present

## 2021-10-15 LAB — SURGICAL PATHOLOGY

## 2021-10-15 MED ORDER — METHOCARBAMOL 750 MG PO TABS: 750.0000 mg | ORAL_TABLET | Freq: Four times a day (QID) | ORAL | 0 refills | Status: DC | PRN

## 2021-10-15 MED ORDER — ASPIRIN 81 MG PO CHEW: 81.0000 mg | CHEWABLE_TABLET | Freq: Two times a day (BID) | ORAL | 0 refills | Status: DC

## 2021-10-15 MED ORDER — OXYCODONE HCL 5 MG PO TABS: 5.0000 mg | ORAL_TABLET | ORAL | 0 refills | Status: DC | PRN

## 2021-10-15 MED ORDER — DOCUSATE SODIUM 100 MG PO CAPS: 100.0000 mg | ORAL_CAPSULE | Freq: Two times a day (BID) | ORAL | 0 refills | Status: DC

## 2021-10-15 NOTE — Discharge Instructions (Signed)

## 2021-10-15 NOTE — Progress Notes (Signed)
Physical Therapy Treatment Patient Details Name: Jordan Salazar MRN: [id number] DOB: 08/21/1945 Today's Date: 10/15/2021   History of Present Illness Pt is a 76 y.o. female who underwent L anterior approach THA on 10/14/21. PMH includes HTN. POD 0 at PT evaluation.   PT Comments    Pt agreeable to PT session prior to breakfast this morning. Pt completed remaining therapeutic exercises from HEP booklet and a review of SAQs. Pt was able to complete AROM heel slides this morning, but required Min A for AAROM SLR. Pt reported pain in L hip at 5-6/10 at start of session, and reported burning sensation during ambulation. Pt ambulated 140 feet x2 bouts with RW and CGA with continuous, step-through gait pattern with occasional slight antalgic gait. Pt verbal education and demonstration provided for safely ascending/descending stairs with RW. Pt completed 4 steps ascending backwards/descending forwards with RW and CGA with +1 assist for stabilizing RW with safe . Pt education provided on car transfers and home safety. Continue to recommend discharge home with Outpatient PT.   Recommendations for follow up therapy are one component of a multi-disciplinary discharge planning process, led by the attending physician.  Recommendations may be updated based on patient status, additional functional criteria and insurance authorization.  Follow Up Recommendations  Outpatient PT     Assistance Recommended at Discharge PRN  Patient can return home with the following A little help with walking and/or transfers;A little help with bathing/dressing/bathroom;Assistance with cooking/housework;Assist for transportation;Help with stairs or ramp for entrance   Equipment Recommendations  Rolling walker (2 wheels)    Recommendations for Other Services       Precautions / Restrictions Precautions Precautions: Anterior Hip;Fall Precaution Booklet Issued: Yes (comment) Restrictions Weight Bearing Restrictions:  Yes LLE Weight Bearing: Weight bearing as tolerated     Mobility  Bed Mobility               General bed mobility comments: NT; pt received and left sitting in recliner. Pt self-reported getting into bed from the right on her own last night which caused pain due to difficulty lifting the L LE into bed, but was able to perform sit-to-supine independently.    Transfers Overall transfer level: Needs assistance Equipment used: Rolling walker (2 wheels) Transfers: Sit to/from Stand Sit to Stand: Supervision           General transfer comment: Supervision for sit-to-stand from chair/plinth with RW.    Ambulation/Gait Ambulation/Gait assistance: Min guard Gait Distance (Feet): 280 Feet (140 feet x2 reps) Assistive device: Rolling walker (2 wheels) Gait Pattern/deviations: Step-through pattern, Antalgic Gait velocity: decreased     General Gait Details: Ambulated with slow, steady, continuous step-through gait pattern with slight antalgic gait utilizing RW and CGA   Stairs Stairs: Yes Stairs assistance: Min guard Stair Management: Backwards, Forwards, With walker Number of Stairs: 4 General stair comments: Pt ascending backwards/descended forwards with RW and CGA with proper sequencing and technique after verbal instruction and demonstration was provided.   Wheelchair Mobility    Modified Rankin (Stroke Patients Only)       Balance Overall balance assessment: Needs assistance Sitting-balance support: No upper extremity supported, Feet supported Sitting balance-Leahy Scale: Good Sitting balance - Comments: Pt able to sit in chair/on plinth with back unsupported, feet supported, no UE support and no LOB   Standing balance support: Bilateral upper extremity supported, No upper extremity supported, During functional activity Standing balance-Leahy Scale: Good Standing balance comment: Pt static standing with no UE on RW  without LOB. During ambulation required bilat  UE support on RW for balance.                            Cognition Arousal/Alertness: Awake/alert Behavior During Therapy: WFL for tasks assessed/performed Overall Cognitive Status: Within Functional Limits for tasks assessed                                          Exercises Total Joint Exercises Short Arc Quad: AROM, Strengthening, Left, 10 reps, Other (comment) Heel Slides: AROM, Left, 10 reps, Other (comment) (seated in recliner with LEs elevated) Hip ABduction/ADduction: AROM, Left, 10 reps, Other (comment) (seated in recliner with LEs elevated) Straight Leg Raises: AAROM, Strengthening, Left, 5 reps, Other (comment) (seated in recliner with LEs elevated with Min A to complete SLR) Long Arc Quad: AROM, Strengthening, Left, 10 reps, Seated Knee Flexion: AROM, Left, 10 reps, Seated Other Exercises Other Exercises: Pt education provided on car transfers and home safety. Answered pts questions related to mobility and return home.    General Comments        Pertinent Vitals/Pain Pain Assessment Pain Assessment: 0-10 Pain Score: 6  Pain Location: L hip Pain Descriptors / Indicators: Burning Pain Intervention(s): Limited activity within patient's tolerance, Monitored during session, Premedicated before session, Repositioned, Ice applied    Home Living                          Prior Function            PT Goals (current goals can now be found in the care plan section) Acute Rehab PT Goals Patient Stated Goal: to go home PT Goal Formulation: With patient Time For Goal Achievement: 10/28/21 Potential to Achieve Goals: Good Progress towards PT goals: Progressing toward goals    Frequency    BID      PT Plan Current plan remains appropriate    Co-evaluation              AM-PAC PT "6 Clicks" Mobility   Outcome Measure  Help needed turning from your back to your side while in a flat bed without using bedrails?:  None Help needed moving from lying on your back to sitting on the side of a flat bed without using bedrails?: A Little Help needed moving to and from a bed to a chair (including a wheelchair)?: A Little Help needed standing up from a chair using your arms (e.g., wheelchair or bedside chair)?: A Little Help needed to walk in hospital room?: A Little Help needed climbing 3-5 steps with a railing? : A Little 6 Click Score: 19    End of Session Equipment Utilized During Treatment: Gait belt Activity Tolerance: Patient tolerated treatment well Patient left: in chair;with call bell/phone within reach;Other (comment) (with blanket under bilat LEs to elevate heels) Nurse Communication: Mobility status;Precautions PT Visit Diagnosis: Other abnormalities of gait and mobility (R26.89);Muscle weakness (generalized) (M62.81);Pain Pain - Right/Left: Left Pain - part of body: Hip     Time: 5621-3086 PT Time Calculation (min) (ACUTE ONLY): 29 min  Charges:                        Radford Pax, SPT  Fay Bagg 10/15/2021, 10:52 AM

## 2021-10-15 NOTE — Plan of Care (Signed)
  Problem: Activity: Goal: Ability to avoid complications of mobility impairment will improve Outcome: Progressing   Problem: Safety: Goal: Ability to remain free from injury will improve Outcome: Progressing   Problem: Education: Goal: Knowledge of the prescribed therapeutic regimen will improve Outcome: Progressing   Problem: Pain Managment: Goal: General experience of comfort will improve Outcome: Progressing

## 2021-10-15 NOTE — Progress Notes (Addendum)
9371 D/c instructions reviewed with pt and husband. IV removed. All questions and concerns answered. Pt will get dressed. Awaiting walker delivery to room and then can be d/c home  1113 Pt is d/c home and states she will come back and pick up walker, will not wait any longer.   1233 Spoke with Ramon Dredge and Gunnar Fusi. walker has been delivered to nurses station. Husband will pick up walker in a few

## 2021-10-15 NOTE — Progress Notes (Signed)
  Subjective:  Patient reports pain as mild.    Objective:   VITALS:   Vitals:   10/14/21 1531 10/14/21 1634 10/14/21 1932 10/15/21 0307  BP: 135/85 136/65 119/62 125/61  Pulse: 81 88 86 96  Resp: 16 16 16 15   Temp: 98 F (36.7 C) 98 F (36.7 C) 98 F (36.7 C) 98.2 F (36.8 C)  TempSrc: Oral     SpO2: 99% 100% 100% 96%  Weight:      Height:        PHYSICAL EXAM:  Neurologically intact ABD soft Neurovascular intact Sensation intact distally Intact pulses distally Dorsiflexion/Plantar flexion intact Incision: scant drainage No cellulitis present Compartment soft Drsg changed today  LABS  No results found for this or any previous visit (from the past 24 hour(s)).  DG HIP UNILAT WITH PELVIS 1V LEFT  Result Date: 10/14/2021 CLINICAL DATA:  LEFT hip replacement EXAM: DG HIP (WITH OR WITHOUT PELVIS) 1V*L* COMPARISON:  12/18/2012 complication. Fluoroscopy time: 0 minutes 18 seconds Dose: 4.60 mGy Images obtained: 4 FINDINGS: Images demonstrate placement of a LEFT hip prosthesis. No acute fracture or dislocation. IMPRESSION: LEFT hip prosthesis without acute Electronically Signed   By: 12/20/2012 M.D.   On: 10/14/2021 09:36   DG C-Arm 1-60 Min-No Report  Result Date: 10/14/2021 Fluoroscopy was utilized by the requesting physician.  No radiographic interpretation.    Assessment/Plan: 1 Day Post-Op   Principal Problem:   History of total hip replacement, left   Advance diet Up with therapy Discharge home today   12/15/2021 , PA-C 10/15/2021, 7:13 AM

## 2021-10-15 NOTE — Progress Notes (Addendum)
Spoke with the patient She lives at home with her husband, he provides transportation She has a rollator, shower seat and cane at home and does not feel that she needs a 3 in 1, she will need a RW and adpat will deliver to the room She has an appointment Wed for Outpatient PT No additional needs

## 2021-10-15 NOTE — Discharge Summary (Signed)
Physician Discharge Summary  Patient ID: Jordan Salazar MRN: [id number] DOB/AGE: [birth year] 76 y.o.  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    She was given perioperative antibiotics:  Anti-infectives (From admission, onward)    Start     Dose/Rate Route Frequency Ordered Stop   10/14/21 1400  ceFAZolin (ANCEF) IVPB 2g/100 mL premix        2 g 200 mL/hr over 30 Minutes Intravenous Every 6 hours 10/14/21 0946 10/14/21 2200   10/14/21 0613  ceFAZolin (ANCEF) 2-4 GM/100ML-% IVPB       Note to Pharmacy: Gwynneth Aliment J: cabinet override      10/14/21 0613 10/14/21 0824   10/14/21 0600  ceFAZolin (ANCEF) IVPB 2g/100 mL premix        2 g 200 mL/hr over 30 Minutes Intravenous On call to O.R. 10/14/21 0128 10/14/21 0815     .  She was given sequential compression devices, early ambulation, and aspirin 81 mg twice a day for 30 days for DVT prophylaxis.  She benefited maximally from the hospital stay and there were no complications.    Recent vital signs:  Vitals:   10/14/21 1932 10/15/21 0307  BP: 119/62 125/61  Pulse: 86 96  Resp: 16 15  Temp: 98 F (36.7 C) 98.2 F (36.8 C)  SpO2: 100% 96%    Recent laboratory studies:  Lab Results   Component Value Date   HGB 13.2 10/12/2021   Lab Results  Component Value Date   WBC 5.3 10/12/2021   PLT 316 10/12/2021   No results found for: "INR" Lab Results  Component Value Date   NA 140 10/12/2021   K 4.0 10/12/2021   CL 104 10/12/2021   CO2 27 10/12/2021   BUN 20 10/12/2021   CREATININE 0.84 10/12/2021   GLUCOSE 108 (H) 10/12/2021    Discharge Medications:   Allergies as of 10/15/2021   No Known Allergies      Medication List     TAKE these medications    acetaminophen 500 MG tablet Commonly known as: TYLENOL Take 1,000 mg by mouth every 6 (six) hours as needed for mild pain.   aspirin 81 MG chewable tablet Chew 1 tablet (81 mg total) by mouth 2 (two) times daily.   calcium carbonate 500 MG chewable tablet Commonly known as: TUMS - dosed in mg elemental calcium Chew 2 tablets by mouth daily.   CANNABIDIOL PO Take 25 mg by mouth daily.   CINNAMON PO Take 2,000 mg by mouth daily.   docusate sodium 100 MG capsule Commonly known as: COLACE Take 1 capsule (100 mg total) by mouth 2 (two) times daily.   Fish Oil 1200 MG Cpdr Take 2 capsules by mouth daily.   gabapentin 300 MG capsule Commonly known as: NEURONTIN TAKE ONE CAPSULE BY MOUTH EVERY  DAY AT BEDTIME   Garlic 1000 MG Caps Take 1 capsule by mouth daily.   Magnesium 500 MG Tabs Take 1 tablet by mouth daily.   Melatonin 10 MG Tabs Take 1 tablet by mouth at bedtime.   methocarbamol 750 MG tablet Commonly known as: Robaxin-750 Take 1 tablet (750 mg total) by mouth every 6 (six) hours as needed for muscle spasms.   oxyCODONE 5 MG immediate release tablet Commonly known as: Roxicodone Take 1 tablet (5 mg total) by mouth every 4 (four) hours as needed for severe pain.   ramipril 2.5 MG capsule Commonly known as: ALTACE Take 2.5 mg by mouth daily.   TART CHERRY PO Take 1,200 mg by mouth daily.   TURMERIC PO Take 1,500 mg by mouth daily.               Durable Medical  Equipment  (From admission, onward)           Start     Ordered   10/15/21 0718  For home use only DME 3 n 1  Once        10/15/21 0717   10/15/21 0718  For home use only DME Walker rolling  Once       Question Answer Comment  Walker: With 5 Inch Wheels   Patient needs a walker to treat with the following condition Osteoarthritis of left hip      10/15/21 0717   10/14/21 2144  For home use only DME Walker rolling  Once       Question Answer Comment  Walker: With 5 Inch Wheels   Patient needs a walker to treat with the following condition Difficulty walking      10/14/21 2143   10/14/21 2144  For home use only DME 3 n 1  Once        10/14/21 2144            Diagnostic Studies: DG HIP UNILAT WITH PELVIS 1V LEFT  Result Date: 10/14/2021 CLINICAL DATA:  LEFT hip replacement EXAM: DG HIP (WITH OR WITHOUT PELVIS) 1V*L* COMPARISON:  12/18/2012 complication. Fluoroscopy time: 0 minutes 18 seconds Dose: 4.60 mGy Images obtained: 4 FINDINGS: Images demonstrate placement of a LEFT hip prosthesis. No acute fracture or dislocation. IMPRESSION: LEFT hip prosthesis without acute Electronically Signed   By: Ulyses Southward M.D.   On: 10/14/2021 09:36   DG C-Arm 1-60 Min-No Report  Result Date: 10/14/2021 Fluoroscopy was utilized by the requesting physician.  No radiographic interpretation.   MM 3D SCREEN BREAST BILATERAL  Result Date: 09/24/2021 CLINICAL DATA:  Screening. EXAM: DIGITAL SCREENING BILATERAL MAMMOGRAM WITH TOMOSYNTHESIS AND CAD TECHNIQUE: Bilateral screening digital craniocaudal and mediolateral oblique mammograms were obtained. Bilateral screening digital breast tomosynthesis was performed. The images were evaluated with computer-aided detection. COMPARISON:  Previous exam(s). ACR Breast Density Category b: There are scattered areas of fibroglandular density. FINDINGS: There are no findings suspicious for malignancy. IMPRESSION: No mammographic evidence of malignancy. A result  letter of this screening mammogram will be mailed directly to the patient. RECOMMENDATION: Screening mammogram in one year. (Code:[id number]) BI-RADS CATEGORY  1: Negative. Electronically Signed   By: Gerome Sam III M.D.   On: 09/24/2021 18:56   Disposition: Discharge disposition: 01-Home or Self Care            Signed: [name] ,PA-C 10/15/2021, 7:18 AM

## 2022-01-13 ENCOUNTER — Inpatient Hospital Stay: Payer: Medicare Other | Attending: Oncology | Admitting: Oncology

## 2022-01-13 ENCOUNTER — Inpatient Hospital Stay: Payer: Medicare Other

## 2022-01-13 ENCOUNTER — Encounter: Payer: Self-pay | Admitting: Oncology

## 2022-01-13 VITALS — BP 142/86 | HR 91 | Temp 98.7°F | Resp 18 | Wt 189.0 lb

## 2022-01-13 DIAGNOSIS — I10 Essential (primary) hypertension: Secondary | ICD-10-CM

## 2022-01-13 DIAGNOSIS — D709 Neutropenia, unspecified: Secondary | ICD-10-CM | POA: Insufficient documentation

## 2022-01-13 DIAGNOSIS — D708 Other neutropenia: Secondary | ICD-10-CM

## 2022-01-13 DIAGNOSIS — R718 Other abnormality of red blood cells: Secondary | ICD-10-CM | POA: Diagnosis not present

## 2022-01-13 LAB — TECHNOLOGIST SMEAR REVIEW: Plt Morphology: ADEQUATE

## 2022-01-13 LAB — FOLATE: Folate: 8.1 ng/mL (ref >5.9)

## 2022-01-13 LAB — CBC WITH DIFFERENTIAL/PLATELET
Abs Immature Granulocytes: 0.01 10*3/uL (ref 0.00–0.07)
Basophils Absolute: 0 10*3/uL (ref 0.0–0.1)
Basophils Relative: 0 %
Eosinophils Absolute: 0 10*3/uL (ref 0.0–0.5)
Eosinophils Relative: 1 %
HCT: 36.9 % (ref 36.0–46.0)
Hemoglobin: 11.8 g/dL — ABNORMAL LOW (ref 12.0–15.0)
Immature Granulocytes: 0 %
Lymphocytes Relative: 15 %
Lymphs Abs: 0.7 10*3/uL (ref 0.7–4.0)
MCH: 26.5 pg (ref 26.0–34.0)
MCHC: 32 g/dL (ref 30.0–36.0)
MCV: 82.9 fL (ref 80.0–100.0)
Monocytes Absolute: 0.4 10*3/uL (ref 0.1–1.0)
Monocytes Relative: 10 %
Neutro Abs: 3.4 10*3/uL (ref 1.7–7.7)
Neutrophils Relative %: 74 %
Platelets: 316 10*3/uL (ref 150–400)
RBC: 4.45 MIL/uL (ref 3.87–5.11)
RDW: 16.6 % — ABNORMAL HIGH (ref 11.5–15.5)
WBC: 4.6 10*3/uL (ref 4.0–10.5)
nRBC: 0 % (ref 0.0–0.2)

## 2022-01-13 LAB — VITAMIN B12: Vitamin B-12: 137 pg/mL — ABNORMAL LOW (ref 180–914)

## 2022-01-13 NOTE — Progress Notes (Signed)
Hematology/Oncology Consult note [facility] Telephone:([phone] [phone]  Patient Care Team: Albina Billet, MD as PCP - General (Internal Medicine) Albina Billet, MD (Internal Medicine) Christene Lye, MD (General Surgery)   Name of the patient: Jordan Salazar  [id number]  10/18/1945    Reason for referral-neutropenia   Referring physician-Dr. Hall Busing  Date of visit: 01/13/22   History of presenting illness- Patient is a 76 year old female with a past medical history significant for hypertension and degenerative joint disease who has been referred for neutropenia.  Most recent CBC from 01/04/2022 showed white cell count of 2.4, H&H of 11.7/36.9 with an MCV of 85 and a platelet count of 278.  ANC was low at 1.1.  Prior to that her white cell count was 5.3 in July 2023 but we do not have a differential on that.  Hemoglobin at that time was better at 13.2  Patient donates blood roughly every 2 months.  She states that she has had on and off low white cell counts over the last few years.  I do not have those counts for my review.  She does not have any baseline autoimmune disorders.  No recent changes in her medications.  Appetite and weight have remained normal.  Denies any recent hospitalizations or infections.  ECOG PS- 0  Pain scale- 0   Review of systems- Review of Systems  Constitutional:  Negative for chills, fever, malaise/fatigue and weight loss.  HENT:  Negative for congestion, ear discharge and nosebleeds.   Eyes:  Negative for blurred vision.  Respiratory:  Negative for cough, hemoptysis, sputum production, shortness of breath and wheezing.   Cardiovascular:  Negative for chest pain, palpitations, orthopnea and claudication.  Gastrointestinal:  Negative for abdominal pain, blood in stool, constipation, diarrhea, heartburn, melena, nausea and vomiting.  Genitourinary:  Negative for dysuria, flank pain, frequency, hematuria and  urgency.  Musculoskeletal:  Negative for back pain, joint pain and myalgias.  Skin:  Negative for rash.  Neurological:  Negative for dizziness, tingling, focal weakness, seizures, weakness and headaches.  Endo/Heme/Allergies:  Does not bruise/bleed easily.  Psychiatric/Behavioral:  Negative for depression and suicidal ideas. The patient does not have insomnia.     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   Chronic venous insufficiency 04/25/2017   Varicose veins of bilateral lower extremities with pain 04/25/2017   DJD (degenerative joint disease) 04/25/2017   Family history of breast cancer 09/17/2013     Past Medical History:  Diagnosis Date   Hypertension      Past Surgical History:  Procedure Laterality Date   ABDOMINAL HYSTERECTOMY  2018   BLADDER SURGERY  2018   BREAST CYST EXCISION Right    sebaceous cyst removed   CATARACT EXTRACTION, BILATERAL Bilateral    COLONOSCOPY  2006   Done in [place]   COLONOSCOPY N/A 08/14/2014   Procedure: COLONOSCOPY;  Surgeon: Christene Lye, MD;  Location: [facility] ENDOSCOPY;  Service: Endoscopy;  Laterality: N/A;   NASAL SINUS SURGERY  1980   TOTAL HIP ARTHROPLASTY Left 10/14/2021   Procedure: TOTAL HIP ARTHROPLASTY ANTERIOR APPROACH;  Surgeon: Lovell Sheehan, MD;  Location: [facility] ORS;  Service: Orthopedics;  Laterality: Left;   uterian ablation      Social History   Socioeconomic History   Marital status: Married    Spouse name: Not on file   Number of children: Not on  file   Years of education: Not on file   Highest education level: Not on file  Occupational History   Not on file  Tobacco Use   Smoking status: Never   Smokeless tobacco: Never  Vaping Use   Vaping Use: Never used  Substance and Sexual Activity   Alcohol use: Yes    Comment: occasional wine.   Drug use: No   Sexual activity: Not on file  Other Topics Concern    Not on file  Social History Narrative   Not on file   Social Determinants of Health   Financial Resource Strain: Not on file  Food Insecurity: Not on file  Transportation Needs: Not on file  Physical Activity: Not on file  Stress: Not on file  Social Connections: Not on file  Intimate Partner Violence: Not on file     Family History  Problem Relation Age of Onset   Breast cancer Mother        14   Hypertension Father    Hypertension Sister    Breast cancer Sister        93   Anesthesia problems Neg Hx    Clotting disorder Neg Hx    Diabetes Neg Hx      Current Outpatient Medications:    acetaminophen (TYLENOL) 500 MG tablet, Take 1,000 mg by mouth every 6 (six) hours as needed for mild pain., Disp: , Rfl:    calcium carbonate (TUMS - DOSED IN MG ELEMENTAL CALCIUM) 500 MG chewable tablet, Chew 2 tablets by mouth daily., Disp: , Rfl:    CANNABIDIOL PO, Take 25 mg by mouth daily., Disp: , Rfl:    CINNAMON PO, Take 2,000 mg by mouth daily., Disp: , Rfl:    gabapentin (NEURONTIN) 300 MG capsule, TAKE ONE CAPSULE BY MOUTH EVERY DAY AT BEDTIME, Disp: , Rfl:    Garlic 4944 MG CAPS, Take 1 capsule by mouth daily., Disp: , Rfl:    Magnesium 500 MG TABS, Take 1 tablet by mouth daily., Disp: , Rfl:    Melatonin 10 MG TABS, Take 1 tablet by mouth at bedtime., Disp: , Rfl:    Omega-3 Fatty Acids (FISH OIL) 1200 MG CPDR, Take 2 capsules by mouth daily. , Disp: , Rfl:    ramipril (ALTACE) 2.5 MG capsule, Take 2.5 mg by mouth daily., Disp: , Rfl:    TART CHERRY PO, Take 1,200 mg by mouth daily., Disp: , Rfl:    TURMERIC PO, Take 1,500 mg by mouth daily., Disp: , Rfl:    Physical exam:  Vitals:   01/13/22 1429  BP: (!) 142/86  Pulse: 91  Resp: 18  Temp: 98.7 F (37.1 C)  SpO2: 100%  Weight: 189 lb (85.7 kg)   Physical Exam Constitutional:      General: She is not in acute distress. Cardiovascular:     Rate and Rhythm: Normal rate and regular rhythm.     Heart sounds: Normal  heart sounds.  Pulmonary:     Effort: Pulmonary effort is normal.     Breath sounds: Normal breath sounds.  Abdominal:     General: Bowel sounds are normal.     Palpations: Abdomen is soft.     Comments: No palpable hepatosplenomegaly  Musculoskeletal:     Cervical back: Normal range of motion.  Lymphadenopathy:     Comments: No palpable cervical, supraclavicular, axillary or inguinal adenopathy    Skin:    General: Skin is warm and dry.  Neurological:  Mental Status: She is alert and oriented to person, place, and time.           Latest Ref Rng & Units 10/12/2021    3:13 PM  CMP  Glucose 70 - 99 mg/dL 108   BUN 8 - 23 mg/dL 20   Creatinine 0.44 - 1.00 mg/dL 0.84   Sodium 135 - 145 mmol/L 140   Potassium 3.5 - 5.1 mmol/L 4.0   Chloride 98 - 111 mmol/L 104   CO2 22 - 32 mmol/L 27   Calcium 8.9 - 10.3 mg/dL 10.0       Latest Ref Rng & Units 10/12/2021    3:13 PM  CBC  WBC 4.0 - 10.5 K/uL 5.3   Hemoglobin 12.0 - 15.0 g/dL 13.2   Hematocrit 36.0 - 46.0 % 40.5   Platelets 150 - 400 K/uL 316    Assessment and plan- Patient is a 76 y.o. female referred for neutropenia  Neutropenia: As per patient history it appears to be chronic and I will try to obtain her prior CBCs over the last couple of years.  I will check B12, folate, smear review, HIV and hepatitis C testing.  Virtual visit with me in 2 to 3 weeks time.  Her platelet counts are normal and hemoglobin is mildly low.  Suspicion of a primary bone marrow disorder is low at this time.  She does not require a bone marrow biopsy for now.  Microcytosis: Possibly secondary to iron deficiency in the setting of frequent blood transfusions.  Repeat CBC today   Thank you for this kind referral and the opportunity to participate in the care of this  Patient   Visit Diagnosis 1. Other neutropenia ([place])   2. Microcytosis     Dr. Randa Evens, MD, MPH [facility] [phone] 01/13/2022

## 2022-01-14 ENCOUNTER — Telehealth: Payer: Self-pay | Admitting: *Deleted

## 2022-01-14 LAB — HEPATITIS C ANTIBODY: HCV Ab: NONREACTIVE

## 2022-01-14 NOTE — Telephone Encounter (Signed)
Called the pt to tell her about th b12 levels and rec: b12 over the counter and take 1053mcg daily. Pt agreeable to this but someone had called her about 1 hour ago. And said same thing. She will glad to get it and start

## 2022-01-15 LAB — HIV ANTIBODY (ROUTINE TESTING W REFLEX): HIV Screen 4th Generation wRfx: NONREACTIVE

## 2022-02-03 ENCOUNTER — Encounter: Payer: Self-pay | Admitting: Oncology

## 2022-02-03 ENCOUNTER — Inpatient Hospital Stay: Payer: Medicare Other | Attending: Oncology | Admitting: Oncology

## 2022-02-03 DIAGNOSIS — E538 Deficiency of other specified B group vitamins: Secondary | ICD-10-CM

## 2022-02-03 DIAGNOSIS — D708 Other neutropenia: Secondary | ICD-10-CM | POA: Diagnosis not present

## 2022-02-03 NOTE — Progress Notes (Signed)
I connected with Domingo Pulse on 02/03/22 at  1:00 PM EDT by video enabled telemedicine visit and verified that I am speaking with the correct person using two identifiers.   I discussed the limitations, risks, security and privacy concerns of performing an evaluation and management service by telemedicine and the availability of in-person appointments. I also discussed with the patient that there may be a patient responsible charge related to this service. The patient expressed understanding and agreed to proceed.  Other persons participating in the visit and their role in the encounter:  none  Patient's location:  home Provider's location:  work  Risk analyst Complaint: Discuss results of blood work  History of present illness: Patient is a 77 year old female with a past medical history significant for hypertension and degenerative joint disease who has been referred for neutropenia.  Most recent CBC from 01/04/2022 showed white cell count of 2.4, H&H of 11.7/36.9 with an MCV of 85 and a platelet count of 278.  ANC was low at 1.1.  Prior to that her white cell count was 5.3 in July 2023 but we do not have a differential on that.  Hemoglobin at that time was better at 13.2    Interval history patient is doing well overall and denies any specific complaints at this time.  She is currently taking oral B12   Review of Systems  Constitutional:  Negative for chills, fever, malaise/fatigue and weight loss.  HENT:  Negative for congestion, ear discharge and nosebleeds.   Eyes:  Negative for blurred vision.  Respiratory:  Negative for cough, hemoptysis, sputum production, shortness of breath and wheezing.   Cardiovascular:  Negative for chest pain, palpitations, orthopnea and claudication.  Gastrointestinal:  Negative for abdominal pain, blood in stool, constipation, diarrhea, heartburn, melena, nausea and vomiting.  Genitourinary:  Negative for dysuria, flank pain, frequency, hematuria and urgency.   Musculoskeletal:  Negative for back pain, joint pain and myalgias.  Skin:  Negative for rash.  Neurological:  Negative for dizziness, tingling, focal weakness, seizures, weakness and headaches.  Endo/Heme/Allergies:  Does not bruise/bleed easily.  Psychiatric/Behavioral:  Negative for depression and suicidal ideas. The patient does not have insomnia.     No Known Allergies  Past Medical History:  Diagnosis Date   Hypertension     Past Surgical History:  Procedure Laterality Date   ABDOMINAL HYSTERECTOMY  2018   BLADDER SURGERY  2018   BREAST CYST EXCISION Right    sebaceous cyst removed   CATARACT EXTRACTION, BILATERAL Bilateral    COLONOSCOPY  2006   Done in [place] N/A 08/14/2014   Procedure: COLONOSCOPY;  Surgeon: Christene Lye, MD;  Location: [facility] ENDOSCOPY;  Service: Endoscopy;  Laterality: N/A;   NASAL SINUS SURGERY  1980   TOTAL HIP ARTHROPLASTY Left 10/14/2021   Procedure: TOTAL HIP ARTHROPLASTY ANTERIOR APPROACH;  Surgeon: Lovell Sheehan, MD;  Location: [facility] ORS;  Service: Orthopedics;  Laterality: Left;   uterian ablation      Social History   Socioeconomic History   Marital status: Married    Spouse name: Not on file   Number of children: Not on file   Years of education: Not on file   Highest education level: Not on file  Occupational History   Not on file  Tobacco Use   Smoking status: Never   Smokeless tobacco: Never  Vaping Use   Vaping Use: Never used  Substance and Sexual Activity   Alcohol use: Yes    Comment:  occasional wine.   Drug use: No   Sexual activity: Not on file  Other Topics Concern   Not on file  Social History Narrative   Not on file   Social Determinants of Health   Financial Resource Strain: Not on file  Food Insecurity: Not on file  Transportation Needs: Not on file  Physical Activity: Not on file  Stress: Not on file  Social Connections: Not on file  Intimate Partner Violence: Not on file     Family History  Problem Relation Age of Onset   Breast cancer Mother        63   Hypertension Father    Hypertension Sister    Breast cancer Sister        38   Anesthesia problems Neg Hx    Clotting disorder Neg Hx    Diabetes Neg Hx      Current Outpatient Medications:    acetaminophen (TYLENOL) 500 MG tablet, Take 1,000 mg by mouth every 6 (six) hours as needed for mild pain., Disp: , Rfl:    calcium carbonate (TUMS - DOSED IN MG ELEMENTAL CALCIUM) 500 MG chewable tablet, Chew 2 tablets by mouth daily., Disp: , Rfl:    CANNABIDIOL PO, Take 25 mg by mouth daily., Disp: , Rfl:    CINNAMON PO, Take 2,000 mg by mouth daily., Disp: , Rfl:    cyanocobalamin (VITAMIN B12) 1000 MCG tablet, Take 1,000 mcg by mouth daily., Disp: , Rfl:    gabapentin (NEURONTIN) 300 MG capsule, TAKE ONE CAPSULE BY MOUTH EVERY DAY AT BEDTIME, Disp: , Rfl:    Garlic 6803 MG CAPS, Take 1 capsule by mouth daily., Disp: , Rfl:    Magnesium 500 MG TABS, Take 1 tablet by mouth daily., Disp: , Rfl:    Omega-3 Fatty Acids (FISH OIL) 1200 MG CPDR, Take 2 capsules by mouth daily. , Disp: , Rfl:    ramipril (ALTACE) 2.5 MG capsule, Take 2.5 mg by mouth daily., Disp: , Rfl:    TURMERIC PO, Take 1,500 mg by mouth daily., Disp: , Rfl:    TART CHERRY PO, Take 1,200 mg by mouth daily. (Patient not taking: Reported on 02/03/2022), Disp: , Rfl:   No results found.  No images are attached to the encounter.      Latest Ref Rng & Units 10/12/2021    3:13 PM  CMP  Glucose 70 - 99 mg/dL 108   BUN 8 - 23 mg/dL 20   Creatinine 0.44 - 1.00 mg/dL 0.84   Sodium 135 - 145 mmol/L 140   Potassium 3.5 - 5.1 mmol/L 4.0   Chloride 98 - 111 mmol/L 104   CO2 22 - 32 mmol/L 27   Calcium 8.9 - 10.3 mg/dL 10.0       Latest Ref Rng & Units 01/13/2022    3:02 PM  CBC  WBC 4.0 - 10.5 K/uL 4.6   Hemoglobin 12.0 - 15.0 g/dL 11.8   Hematocrit 36.0 - 46.0 % 36.9   Platelets 150 - 400 K/uL 316      Observation/objective: Appears  in no acute distress over video visit today.  Breathing is nonlabored  Assessment and plan: Patient is a 76 year old female referred for neutropenia  Neutropenia: Patient's white cell count waxes and wanes between 3-5.  On her recent count it was 4.6 with an absolute neutrophil count of 3.4.  HIV and hepatitis C testing negative.  B12 levels were low at 137 for which she is taking oral  B12 supplements.  I will repeat her B12 level again in 2 months and I will see her back in 5 months.  There is no indication for bone marrow biopsy at this time for her mild waxing and waning neutropenia in the absence of other significant cytopenias.  Patient verbalized understanding of the plan  Follow-up instructions: As above  I discussed the assessment and treatment plan with the patient. The patient was provided an opportunity to ask questions and all were answered. The patient agreed with the plan and demonstrated an understanding of the instructions.   The patient was advised to call back or seek an in-person evaluation if the symptoms worsen or if the condition fails to improve as anticipated.  I provided 11 minutes of face-to-face video visit time during this encounter, and > 50% was spent counseling as documented under my assessment & plan.  Visit Diagnosis: 1. Other neutropenia ([place])   2. B12 deficiency     Dr. Randa Evens, MD, MPH [facility] at [facility] Tel- [phone] 02/03/2022 5:24 PM

## 2022-04-06 ENCOUNTER — Inpatient Hospital Stay: Payer: Medicare Other | Attending: Oncology

## 2022-04-06 DIAGNOSIS — D509 Iron deficiency anemia, unspecified: Secondary | ICD-10-CM | POA: Diagnosis present

## 2022-04-06 DIAGNOSIS — D708 Other neutropenia: Secondary | ICD-10-CM

## 2022-04-06 LAB — CBC
HCT: 35.9 % — ABNORMAL LOW (ref 36.0–46.0)
Hemoglobin: 11.6 g/dL — ABNORMAL LOW (ref 12.0–15.0)
MCH: 26 pg (ref 26.0–34.0)
MCHC: 32.3 g/dL (ref 30.0–36.0)
MCV: 80.5 fL (ref 80.0–100.0)
Platelets: 368 10*3/uL (ref 150–400)
RBC: 4.46 MIL/uL (ref 3.87–5.11)
RDW: 18.1 % — ABNORMAL HIGH (ref 11.5–15.5)
WBC: 5.2 10*3/uL (ref 4.0–10.5)
nRBC: 0 % (ref 0.0–0.2)

## 2022-04-06 LAB — FERRITIN: Ferritin: 12 ng/mL (ref 11–307)

## 2022-04-06 LAB — IRON AND TIBC
Iron: 30 ug/dL (ref 28–170)
Saturation Ratios: 7 % — ABNORMAL LOW (ref 10.4–31.8)
TIBC: 433 ug/dL (ref 250–450)
UIBC: 403 ug/dL

## 2022-04-06 LAB — VITAMIN B12: Vitamin B-12: 537 pg/mL (ref 180–914)

## 2022-04-21 ENCOUNTER — Inpatient Hospital Stay: Payer: Medicare Other

## 2022-04-21 ENCOUNTER — Other Ambulatory Visit: Payer: Self-pay | Admitting: Medical Oncology

## 2022-04-21 VITALS — BP 147/75 | HR 80 | Temp 98.1°F | Resp 18

## 2022-04-21 DIAGNOSIS — D509 Iron deficiency anemia, unspecified: Secondary | ICD-10-CM

## 2022-04-21 DIAGNOSIS — D708 Other neutropenia: Secondary | ICD-10-CM

## 2022-04-21 MED ORDER — SODIUM CHLORIDE 0.9 % IV SOLN
510.0000 mg | Freq: Once | INTRAVENOUS | Status: AC
  Administered 2022-04-21: 510 mg via INTRAVENOUS
  Filled 2022-04-21: qty 510

## 2022-04-21 MED ORDER — SODIUM CHLORIDE 0.9 % IV SOLN
Freq: Once | INTRAVENOUS | Status: AC
  Filled 2022-04-21: qty 250

## 2022-04-21 MED ORDER — SODIUM CHLORIDE 0.9 % IV SOLN
Freq: Once | INTRAVENOUS | Status: DC
  Filled 2022-04-21: qty 250

## 2022-04-28 ENCOUNTER — Inpatient Hospital Stay: Payer: Medicare Other

## 2022-04-28 VITALS — BP 127/68 | HR 77 | Temp 97.3°F | Resp 16

## 2022-04-28 DIAGNOSIS — D509 Iron deficiency anemia, unspecified: Secondary | ICD-10-CM | POA: Diagnosis not present

## 2022-04-28 MED ORDER — SODIUM CHLORIDE 0.9 % IV SOLN
510.0000 mg | Freq: Once | INTRAVENOUS | Status: AC
  Administered 2022-04-28: 510 mg via INTRAVENOUS
  Filled 2022-04-28: qty 17

## 2022-04-28 MED ORDER — LORATADINE 10 MG PO TABS: 10.0000 mg | ORAL_TABLET | Freq: Every day | ORAL | Status: DC

## 2022-04-28 MED ORDER — SODIUM CHLORIDE 0.9 % IV SOLN
Freq: Once | INTRAVENOUS | Status: AC
  Filled 2022-04-28: qty 250

## 2022-04-28 NOTE — Patient Instructions (Signed)

## 2022-07-02 ENCOUNTER — Other Ambulatory Visit: Payer: Self-pay | Admitting: *Deleted

## 2022-07-02 DIAGNOSIS — D509 Iron deficiency anemia, unspecified: Secondary | ICD-10-CM

## 2022-07-02 NOTE — Progress Notes (Signed)
cbc

## 2022-07-05 ENCOUNTER — Inpatient Hospital Stay: Payer: Medicare Other | Attending: Oncology

## 2022-07-05 ENCOUNTER — Inpatient Hospital Stay (HOSPITAL_BASED_OUTPATIENT_CLINIC_OR_DEPARTMENT_OTHER): Payer: Medicare Other | Admitting: Oncology

## 2022-07-05 ENCOUNTER — Encounter: Payer: Self-pay | Admitting: Oncology

## 2022-07-05 VITALS — BP 135/78 | HR 81 | Temp 98.6°F | Resp 18 | Ht 68.0 in | Wt 182.9 lb

## 2022-07-05 DIAGNOSIS — Z803 Family history of malignant neoplasm of breast: Secondary | ICD-10-CM | POA: Insufficient documentation

## 2022-07-05 DIAGNOSIS — E538 Deficiency of other specified B group vitamins: Secondary | ICD-10-CM | POA: Diagnosis not present

## 2022-07-05 DIAGNOSIS — D72819 Decreased white blood cell count, unspecified: Secondary | ICD-10-CM | POA: Diagnosis not present

## 2022-07-05 DIAGNOSIS — D509 Iron deficiency anemia, unspecified: Secondary | ICD-10-CM | POA: Insufficient documentation

## 2022-07-05 LAB — CBC WITH DIFFERENTIAL/PLATELET
Abs Immature Granulocytes: 0.01 10*3/uL (ref 0.00–0.07)
Basophils Absolute: 0 10*3/uL (ref 0.0–0.1)
Basophils Relative: 1 %
Eosinophils Absolute: 0 10*3/uL (ref 0.0–0.5)
Eosinophils Relative: 1 %
HCT: 44.5 % (ref 36.0–46.0)
Hemoglobin: 14.8 g/dL (ref 12.0–15.0)
Immature Granulocytes: 0 %
Lymphocytes Relative: 21 %
Lymphs Abs: 0.9 10*3/uL (ref 0.7–4.0)
MCH: 29.5 pg (ref 26.0–34.0)
MCHC: 33.3 g/dL (ref 30.0–36.0)
MCV: 88.8 fL (ref 80.0–100.0)
Monocytes Absolute: 0.5 10*3/uL (ref 0.1–1.0)
Monocytes Relative: 12 %
Neutro Abs: 2.8 10*3/uL (ref 1.7–7.7)
Neutrophils Relative %: 65 %
Platelets: 301 10*3/uL (ref 150–400)
RBC: 5.01 MIL/uL (ref 3.87–5.11)
RDW: 18.6 % — ABNORMAL HIGH (ref 11.5–15.5)
WBC: 4.3 10*3/uL (ref 4.0–10.5)
nRBC: 0 % (ref 0.0–0.2)

## 2022-07-05 LAB — IRON AND TIBC
Iron: 61 ug/dL (ref 28–170)
Saturation Ratios: 16 % (ref 10.4–31.8)
TIBC: 377 ug/dL (ref 250–450)
UIBC: 316 ug/dL

## 2022-07-05 LAB — FERRITIN: Ferritin: 41 ng/mL (ref 11–307)

## 2022-07-05 LAB — VITAMIN B12: Vitamin B-12: 713 pg/mL (ref 180–914)

## 2022-07-05 NOTE — Progress Notes (Signed)
Hematology/Oncology Consult note [facility]  Telephone:([phone] [phone]  Patient Care Team: Albina Billet, MD as PCP - General (Internal Medicine) Albina Billet, MD (Internal Medicine) Christene Lye, MD (General Surgery)   Name of the patient: Jordan Salazar  [id number]  [birth year]   Date of visit: 07/05/22  Diagnosis-mild leukopenia with no significant neutropenia  Chief complaint/ Reason for visit-routine follow-up of leukopenia  Heme/Onc history:  Patient is a 77 year old female with a past medical history significant for hypertension and degenerative joint disease who has been referred for neutropenia.  Most recent CBC from 01/04/2022 showed white cell count of 2.4, H&H of 11.7/36.9 with an MCV of 85 and a platelet count of 278.  ANC was low at 1.1.  Prior to that her white cell count was 5.3 in July 2023 but we do not have a differential on that.  Hemoglobin at that time was better at 13.2   Workup for leukopenia was otherwise unremarkable.  B12 levels were low at 137 which improved to 537 with oral B12 supplementation.  Ferritin levels were low at 12 HIV and hepatitis C testing was negative.  Interval history-patient is doing well presently and denies any specific complaints at this time  ECOG PS- 1 Pain scale- 0 Opioid associated constipation- no  Review of systems- Review of Systems  Constitutional:  Negative for chills, fever, malaise/fatigue and weight loss.  HENT:  Negative for congestion, ear discharge and nosebleeds.   Eyes:  Negative for blurred vision.  Respiratory:  Negative for cough, hemoptysis, sputum production, shortness of breath and wheezing.   Cardiovascular:  Negative for chest pain, palpitations, orthopnea and claudication.  Gastrointestinal:  Negative for abdominal pain, blood in stool, constipation, diarrhea, heartburn, melena, nausea and vomiting.  Genitourinary:  Negative for dysuria, flank pain,  frequency, hematuria and urgency.  Musculoskeletal:  Negative for back pain, joint pain and myalgias.  Skin:  Negative for rash.  Neurological:  Negative for dizziness, tingling, focal weakness, seizures, weakness and headaches.  Endo/Heme/Allergies:  Does not bruise/bleed easily.  Psychiatric/Behavioral:  Negative for depression and suicidal ideas. The patient does not have insomnia.       No Known Allergies   Past Medical History:  Diagnosis Date   Hypertension      Past Surgical History:  Procedure Laterality Date   ABDOMINAL HYSTERECTOMY  2018   BLADDER SURGERY  2018   BREAST CYST EXCISION Right    sebaceous cyst removed   CATARACT EXTRACTION, BILATERAL Bilateral    COLONOSCOPY  2006   Done in [place] N/A 08/14/2014   Procedure: COLONOSCOPY;  Surgeon: Christene Lye, MD;  Location: [facility] ENDOSCOPY;  Service: Endoscopy;  Laterality: N/A;   NASAL SINUS SURGERY  1980   TOTAL HIP ARTHROPLASTY Left 10/14/2021   Procedure: TOTAL HIP ARTHROPLASTY ANTERIOR APPROACH;  Surgeon: Lovell Sheehan, MD;  Location: [facility] ORS;  Service: Orthopedics;  Laterality: Left;   uterian ablation      Social History   Socioeconomic History   Marital status: Married    Spouse name: Not on file   Number of children: Not on file   Years of education: Not on file   Highest education level: Not on file  Occupational History   Not on file  Tobacco Use   Smoking status: Never   Smokeless tobacco: Never  Vaping Use   Vaping Use: Never used  Substance and Sexual Activity   Alcohol use: Yes  Comment: occasional wine.   Drug use: No   Sexual activity: Not on file  Other Topics Concern   Not on file  Social History Narrative   Not on file   Social Determinants of Health   Financial Resource Strain: Not on file  Food Insecurity: Not on file  Transportation Needs: Not on file  Physical Activity: Not on file  Stress: Not on file  Social Connections: Not on file   Intimate Partner Violence: Not on file    Family History  Problem Relation Age of Onset   Breast cancer Mother        75   Hypertension Father    Hypertension Sister    Breast cancer Sister        6   Anesthesia problems Neg Hx    Clotting disorder Neg Hx    Diabetes Neg Hx      Current Outpatient Medications:    acetaminophen (TYLENOL) 500 MG tablet, Take 1,000 mg by mouth every 6 (six) hours as needed for mild pain., Disp: , Rfl:    calcium carbonate (TUMS - DOSED IN MG ELEMENTAL CALCIUM) 500 MG chewable tablet, Chew 2 tablets by mouth daily., Disp: , Rfl:    CANNABIDIOL PO, Take 25 mg by mouth daily., Disp: , Rfl:    celecoxib (CELEBREX) 200 MG capsule, Take 200 mg by mouth daily., Disp: , Rfl:    CINNAMON PO, Take 2,000 mg by mouth daily., Disp: , Rfl:    cyanocobalamin (VITAMIN B12) 1000 MCG tablet, Take 1,000 mcg by mouth daily., Disp: , Rfl:    gabapentin (NEURONTIN) 300 MG capsule, TAKE ONE CAPSULE BY MOUTH EVERY DAY AT BEDTIME, Disp: , Rfl:    Garlic 123XX123 MG CAPS, Take 1 capsule by mouth daily., Disp: , Rfl:    Magnesium 500 MG TABS, Take 1 tablet by mouth daily., Disp: , Rfl:    Omega-3 Fatty Acids (FISH OIL) 1200 MG CPDR, Take 2 capsules by mouth daily. , Disp: , Rfl:    ramipril (ALTACE) 2.5 MG capsule, Take 2.5 mg by mouth daily., Disp: , Rfl:    TURMERIC PO, Take 1,500 mg by mouth daily., Disp: , Rfl:   Physical exam:  Vitals:   07/05/22 1044  BP: 135/78  Pulse: 81  Resp: 18  Temp: 98.6 F (37 C)  TempSrc: Tympanic  SpO2: 100%  Weight: 182 lb 14.4 oz (83 kg)  Height: 5\' 8"  (1.727 m)   Physical Exam Cardiovascular:     Rate and Rhythm: Normal rate and regular rhythm.     Heart sounds: Normal heart sounds.  Pulmonary:     Effort: Pulmonary effort is normal.     Breath sounds: Normal breath sounds.  Abdominal:     General: Bowel sounds are normal.     Palpations: Abdomen is soft.  Skin:    General: Skin is warm and dry.  Neurological:     Mental  Status: She is alert and oriented to person, place, and time.         Latest Ref Rng & Units 10/12/2021    3:13 PM  CMP  Glucose 70 - 99 mg/dL 108   BUN 8 - 23 mg/dL 20   Creatinine 0.44 - 1.00 mg/dL 0.84   Sodium 135 - 145 mmol/L 140   Potassium 3.5 - 5.1 mmol/L 4.0   Chloride 98 - 111 mmol/L 104   CO2 22 - 32 mmol/L 27   Calcium 8.9 - 10.3 mg/dL 10.0  Latest Ref Rng & Units 07/05/2022   10:35 AM  CBC  WBC 4.0 - 10.5 K/uL 4.3   Hemoglobin 12.0 - 15.0 g/dL 14.8   Hematocrit 36.0 - 46.0 % 44.5   Platelets 150 - 400 K/uL 301      Assessment and plan- Patient is a 77 y.o. female referred for leukopenia  Patient has had mild waxing and waning leukopenia with a white cell count that fluctuates around 4.  She has never been overtly neutropenic.  HIV and hepatitis C testing negative.  Today her white count is 2.3 with an ANC of 2.8.  Hemoglobin and platelet counts are normal.  She does not require follow-up with hematology at this time.  History of B12 deficiency: She is currently on oral B12 supplementation and B12 levels 3 months ago had normalized.  Levels from today are pending.  If they fall again she could consider going on monthly B12 injections.  History of iron deficiency without overt anemia: Patient had evidence of iron deficiency based on her labs in January 2024 and she received 2 doses of Feraheme.  Iron levels from today are pending.  Given that she is not overtly anemic this can be followed up by PCP as well.  If iron deficiency recurs she will need referral to GI  Patient can be referred to [place] in the future if questions or concerns arise   Visit Diagnosis 1. Leukopenia, unspecified type   2. Iron deficiency anemia, unspecified iron deficiency anemia type   3. B12 deficiency      Dr. Randa Evens, MD, MPH [facility] at [facility] [phone] 07/05/2022 11:36 AM

## 2022-07-05 NOTE — Progress Notes (Signed)
No concerns for provider.

## 2022-07-16 ENCOUNTER — Other Ambulatory Visit: Payer: Self-pay | Admitting: Internal Medicine

## 2022-07-16 DIAGNOSIS — Z1231 Encounter for screening mammogram for malignant neoplasm of breast: Secondary | ICD-10-CM

## 2022-07-16 DIAGNOSIS — E2839 Other primary ovarian failure: Secondary | ICD-10-CM

## 2022-10-05 ENCOUNTER — Ambulatory Visit
Admission: RE | Admit: 2022-10-05 | Discharge: 2022-10-05 | Disposition: A | Discharge status: Home / Self Care | Payer: Medicare Other | Source: Ambulatory Visit | Attending: Internal Medicine | Admitting: Internal Medicine

## 2022-10-05 DIAGNOSIS — Z1231 Encounter for screening mammogram for malignant neoplasm of breast: Secondary | ICD-10-CM | POA: Insufficient documentation

## 2022-10-05 DIAGNOSIS — E2839 Other primary ovarian failure: Secondary | ICD-10-CM | POA: Diagnosis present

## 2022-12-07 DIAGNOSIS — S8001XA Contusion of right knee, initial encounter: Secondary | ICD-10-CM | POA: Insufficient documentation

## 2022-12-21 DIAGNOSIS — M67432 Ganglion, left wrist: Secondary | ICD-10-CM | POA: Insufficient documentation

## 2022-12-21 DIAGNOSIS — S62102A Fracture of unspecified carpal bone, left wrist, initial encounter for closed fracture: Secondary | ICD-10-CM | POA: Insufficient documentation

## 2023-04-04 ENCOUNTER — Encounter: Payer: Self-pay | Admitting: Internal Medicine

## 2023-05-02 LAB — COLOGUARD: COLOGUARD: NEGATIVE

## 2023-08-22 ENCOUNTER — Other Ambulatory Visit: Payer: Self-pay | Admitting: Internal Medicine

## 2023-08-22 DIAGNOSIS — Z1231 Encounter for screening mammogram for malignant neoplasm of breast: Secondary | ICD-10-CM

## 2023-08-26 ENCOUNTER — Encounter: Payer: Self-pay | Admitting: Internal Medicine

## 2023-10-06 ENCOUNTER — Ambulatory Visit
Admission: RE | Admit: 2023-10-06 | Discharge: 2023-10-06 | Disposition: A | Discharge status: Home / Self Care | Source: Ambulatory Visit | Attending: Internal Medicine | Admitting: Internal Medicine

## 2023-10-06 DIAGNOSIS — Z1231 Encounter for screening mammogram for malignant neoplasm of breast: Secondary | ICD-10-CM | POA: Diagnosis present

## 2023-11-08 ENCOUNTER — Ambulatory Visit (INDEPENDENT_AMBULATORY_CARE_PROVIDER_SITE_OTHER): Admitting: Family Medicine

## 2023-11-08 ENCOUNTER — Encounter: Payer: Self-pay | Admitting: Family Medicine

## 2023-11-08 VITALS — BP 147/79 | HR 67 | Temp 98.1°F | Resp 18 | Ht 68.0 in | Wt 185.0 lb

## 2023-11-08 DIAGNOSIS — D509 Iron deficiency anemia, unspecified: Secondary | ICD-10-CM

## 2023-11-08 DIAGNOSIS — I10 Essential (primary) hypertension: Secondary | ICD-10-CM

## 2023-11-08 DIAGNOSIS — R011 Cardiac murmur, unspecified: Secondary | ICD-10-CM | POA: Diagnosis not present

## 2023-11-08 DIAGNOSIS — I872 Venous insufficiency (chronic) (peripheral): Secondary | ICD-10-CM

## 2023-11-08 DIAGNOSIS — E538 Deficiency of other specified B group vitamins: Secondary | ICD-10-CM

## 2023-11-08 DIAGNOSIS — I83813 Varicose veins of bilateral lower extremities with pain: Secondary | ICD-10-CM

## 2023-11-08 DIAGNOSIS — M150 Primary generalized (osteo)arthritis: Secondary | ICD-10-CM | POA: Diagnosis not present

## 2023-11-08 NOTE — Assessment & Plan Note (Signed)
 Because of iron deficiency anemia not understood.  Eats protein with most meals.  Had a Cologuard that was - 04/25/2023 and does not report GERD.  Got an iron infusion about a year ago.  Will check labs at her physical in January and assess where she is.

## 2023-11-08 NOTE — Assessment & Plan Note (Signed)
 He is on ramipril  2.5 mg daily.  Blood pressure mildly elevated in the office.  This may be whitecoat hypertension because it is the first time she has been here.  Please check your blood pressures at home with an above the elbow fully automatic cuff and turn these into me.  If they are elevated I will invite you for an office visit.

## 2023-11-08 NOTE — Assessment & Plan Note (Signed)
 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  Takes Celebrex 200 mg daily.  Previously took meloxicam but developed hypertension from that.  Please reserve your Celebrex for when you are in pain as it is damaging your kidneys

## 2023-11-08 NOTE — Assessment & Plan Note (Signed)
 Status post sclerotherapy.  Does well with compression hose.

## 2023-11-08 NOTE — Assessment & Plan Note (Signed)
 Status post sclerotherapy.  Wears compression hose and is doing well.

## 2023-11-08 NOTE — Assessment & Plan Note (Signed)
 B12 level was 700 on B12 1000 mg daily.  Appears she can maintain her B12 through oral supplementation.  B12 at diagnosis was 137.

## 2023-11-08 NOTE — Progress Notes (Signed)
 New Patient Office Visit  Subjective    Patient ID: Jordan Salazar, female    DOB: October 27, 1945  Age: 78 y.o. MRN: [id number]  CC:  Chief Complaint  Patient presents with   Establish Care    HPI Jordan Salazar presents to establish care Delightful 78 year old with iron deficiency anemia (iron infusion 2024), chronic venous insufficiency (s/p sclerotherapy), s/p total hip left, leukopenia, HTN, B12 deficiency (137, maintained on 1000 mg daily) She reports a lot of arthritis in her knees lumbar back left wrist she fell and broke this wrist in 2014.  She had a DEXA 10/04/2012 right femur neck T equals -0.7 and left radius T equals -0.3.  She does take vitamin D 2000 international units daily. She takes Celebrex 200 mg most days for her arthritis.  She was previously taking meloxicam but it caused her to have hypertension. She has fallen twice in the last year and fractured her left wrist on 1 of those falls.  She does take vitamin D 2000 international units daily. She is on ramipril  2.5 mg daily for her blood.  She has not been checking her blood pressure at home.  Blood pressure in the office is mildly elevated but it is her first visit here. She had a Cologuard 04/25/2023 that was negative.  Last mammogram 10/23/2023 and it was benign She walks 1.5 to 2 miles daily.  She enjoys traveling by bus and has a trip coming up to [place] . Last labs 07/24/2023.  Hemoglobin 14.8, (previously 11.6 prior to infusion) WBCs 4.3 platelets 301.  Prior to iron infusion, iron saturation was 7% and ferritin was 12.  B12 is 713.   Outpatient Encounter Medications as of 11/08/2023  Medication Sig   acetaminophen  (TYLENOL ) 500 MG tablet Take 1,000 mg by mouth every 6 (six) hours as needed for mild pain.   calcium  carbonate (TUMS - DOSED IN MG ELEMENTAL CALCIUM ) 500 MG chewable tablet Chew 2 tablets by mouth daily.   CANNABIDIOL PO Take 25 mg by mouth daily.   celecoxib (CELEBREX) 200 MG capsule Take 200  mg by mouth daily.   CINNAMON PO Take 2,000 mg by mouth daily.   cyanocobalamin  (VITAMIN B12) 1000 MCG tablet Take 1,000 mcg by mouth daily.   gabapentin  (NEURONTIN ) 300 MG capsule TAKE ONE CAPSULE BY MOUTH EVERY DAY AT BEDTIME   Garlic 1000 MG CAPS Take 1 capsule by mouth daily.   Magnesium  500 MG TABS Take 1 tablet by mouth daily.   Omega-3 Fatty Acids (FISH OIL) 1200 MG CPDR Take 2 capsules by mouth daily.    ramipril  (ALTACE ) 2.5 MG capsule Take 2.5 mg by mouth daily.   TURMERIC PO Take 1,500 mg by mouth daily.   zinc gluconate 50 MG tablet Take 50 mg by mouth daily.   No facility-administered encounter medications on file as of 11/08/2023.    Past Medical History:  Diagnosis Date   Arthritis    Cataract    Hypertension     Past Surgical History:  Procedure Laterality Date   ABDOMINAL HYSTERECTOMY  2018   BLADDER SURGERY  2018   BREAST CYST EXCISION Right    sebaceous cyst removed   CATARACT EXTRACTION, BILATERAL Bilateral    COLONOSCOPY  2006   Done in [place]   COLONOSCOPY N/A 08/14/2014   Procedure: COLONOSCOPY;  Surgeon: Louanne KANDICE Muse, MD;  Location: [facility] END[facility];  Service: Endoscopy;  Laterality: N/A;   JOINT REPLACEMENT  2023   NASAL  SINUS SURGERY  1980   TOTAL HIP ARTHROPLASTY Left 10/14/2021   Procedure: TOTAL HIP ARTHROPLASTY ANTERIOR APPROACH;  Surgeon: Leora Lynwood SAUNDERS, MD;  Location: [facility] ORS;  Service: Orthopedics;  Laterality: Left;   uterian ablation      Family History  Problem Relation Age of Onset   Breast cancer Mother        73   Arthritis Mother    Cancer Mother    Hypertension Father    Hypertension Sister    Breast cancer Sister        36   Cancer Sister    Hypertension Sister    Anesthesia problems Neg Hx    Clotting disorder Neg Hx    Diabetes Neg Hx     Social History   Socioeconomic History   Marital status: Married    Spouse name: Not on file   Number of children: Not on file   Years of education: Not on file    Highest education level: Not on file  Occupational History   Not on file  Tobacco Use   Smoking status: Never    Passive exposure: Never   Smokeless tobacco: Never  Vaping Use   Vaping status: Never Used  Substance and Sexual Activity   Alcohol use: Yes    Comment: occasional wine.   Drug use: No   Sexual activity: Yes    Partners: Male  Other Topics Concern   Not on file  Social History Narrative   Not on file   Social Drivers of Health   Financial Resource Strain: Not on file  Food Insecurity: Not on file  Transportation Needs: Not on file  Physical Activity: Not on file  Stress: Not on file  Social Connections: Not on file  Intimate Partner Violence: Not on file    ROS      Objective   BP (!) 147/79   Pulse 67   Temp 98.1 F (36.7 C) (Oral)   Resp 18   Ht 5' 8 (1.727 m)   Wt 185 lb (83.9 kg)   HC 18 (45.7 cm)   SpO2 96%   BMI 28.13 kg/m    Physical Exam Vitals and nursing note reviewed.  Constitutional:      Appearance: Normal appearance.  HENT:     Head: Normocephalic and atraumatic.  Eyes:     Conjunctiva/sclera: Conjunctivae normal.  Cardiovascular:     Rate and Rhythm: Normal rate and regular rhythm.     Heart sounds: Murmur (SEM LUSB) heard.  Pulmonary:     Effort: Pulmonary effort is normal.     Breath sounds: Normal breath sounds.  Musculoskeletal:     Right lower leg: No edema.     Left lower leg: No edema.  Skin:    General: Skin is warm and dry.  Neurological:     Mental Status: She is alert and oriented to person, place, and time.  Psychiatric:        Mood and Affect: Mood normal.        Behavior: Behavior normal.        Thought Content: Thought content normal.        Judgment: Judgment normal.            The ASCVD Risk score (Arnett DK, et al., 2019) failed to calculate for the following reasons:   Cannot find a previous HDL lab   Cannot find a previous total cholesterol lab     Assessment & Plan:  Cardiac  murmur, unspecified -     ECHOCARDIOGRAM COMPLETE BUBBLE STUDY; Future  Cardiac murmur Assessment & Plan: Aortic SEM.  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  Will check cardiac echo.     Chronic venous insufficiency Assessment & Plan: Status post sclerotherapy.  Wears compression hose and is doing well.   Primary osteoarthritis involving multiple joints Assessment & Plan: Has arthritis in her back and he knees.  Takes Celebrex 200 mg daily.  Previously took meloxicam but developed hypertension from that.  Please reserve your Celebrex for when you are in pain as it is damaging your kidneys    Iron deficiency anemia, unspecified iron deficiency anemia type Assessment & Plan: Because of iron deficiency anemia not understood.  Eats protein with most meals.  Had a Cologuard that was - 04/25/2023 and does not report GERD.  Got an iron infusion about a year ago.  Will check labs at her physical in January and assess where she is.   Varicose veins of bilateral lower extremities with pain Assessment & Plan: Status post sclerotherapy.  Does well with compression hose.   B12 deficiency Assessment & Plan: B12 level was 700 on B12 1000 mg daily.  Appears she can maintain her B12 through oral supplementation.  B12 at diagnosis was 137.   Primary hypertension Assessment & Plan: He is on ramipril  2.5 mg daily.  Blood pressure mildly elevated in the office.  This may be whitecoat hypertension because it is the first time she has been here.  Please check your blood pressures at home with an above the elbow fully automatic cuff and turn these into me.  If they are elevated I will invite you for an office visit.     Return in about 6 months (around 05/10/2024).   Devere MARLA Mock, MD   {    Objective:                       Assessment & Plan:

## 2023-11-08 NOTE — Assessment & Plan Note (Addendum)
 Aortic SEM.  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  Will check cardiac echo.

## 2023-12-30 ENCOUNTER — Ambulatory Visit: Attending: Family Medicine

## 2023-12-30 ENCOUNTER — Telehealth: Payer: Self-pay | Admitting: Family Medicine

## 2023-12-30 DIAGNOSIS — R011 Cardiac murmur, unspecified: Secondary | ICD-10-CM | POA: Diagnosis not present

## 2023-12-30 LAB — ECHOCARDIOGRAM COMPLETE
AR max vel: 2.26 cm2
AV Area VTI: 2.4 cm2
AV Area mean vel: 2.24 cm2
AV Mean grad: 8 mmHg
AV Peak grad: 15.9 mmHg
Ao pk vel: 2 m/s
Area-P 1/2: 3.42 cm2
S' Lateral: 2.64 cm

## 2023-12-30 NOTE — Telephone Encounter (Signed)
 Reviewed cardiac echo results with her.

## 2024-02-13 ENCOUNTER — Ambulatory Visit

## 2024-03-15 ENCOUNTER — Telehealth: Payer: Self-pay | Admitting: Family Medicine

## 2024-03-15 NOTE — Telephone Encounter (Signed)
 Copied from CRM #[id number]. Topic: Clinical - Medical Advice >> Mar 15, 2024  1:54 PM Delon T wrote: Reason for CRM: received a letter from [employer] telling her that her A1c level was in the prediabetes range, please call [phone] to advise if appt is needed or labs

## 2024-03-19 ENCOUNTER — Ambulatory Visit: Payer: Self-pay

## 2024-03-19 NOTE — Telephone Encounter (Signed)
 FYI Only or Action Required?: FYI only for provider: appointment scheduled on 12/17.  Patient was last seen in primary care on 11/08/2023 by Ziglar, Susan K, MD.  Called Nurse Triage reporting Blurred Vision.  Symptoms began several weeks ago.  Interventions attempted: Rest, hydration, or home remedies and Other: eye drops.  Symptoms are: gradually improving.  Triage Disposition: See PCP Within 2 Weeks  Patient/caregiver understands and will follow disposition?: Yes  Copied from CRM [id number]. Topic: Clinical - Red Word Triage >> Mar 19, 2024 11:09 AM [name] wrote: [facility] that prompted transfer to Nurse Triage: Blurred vision, patient states it feels like a film is over her eyes. Patient already had cataract surgery in the past. >> Mar 19, 2024 11:39 AM [name] wrote: Disconnected call. Called patient back and she would prefer a nurse to give her a callback when available. Reason for Disposition  [1] Blurred vision or visual changes AND [2] gradual onset (e.g., weeks, months)  Answer Assessment - Initial Assessment Questions Over last few months- patient feels like there has been a film over both her eyes. Ophthalmologist thought dry eyes. Hasn't really helped much. Not worse. Denies unilateral neurological symptoms. Hx of cataracts surgery. Denies fever dizziness, SOB, CP.   Appt with [facility] 12/17 to assess. ED precautions understood.   1. DESCRIPTION: How has your vision changed? (e.g., complete vision loss, blurred vision, double vision, floaters, etc.)     Blurred vision- like a film over your eyes 2. LOCATION: One or both eyes? If one, ask: Which eye?     Both eyes 3. SEVERITY: Can you see anything? If Yes, ask: What can you see? (e.g., fine print)     Not as clear  4. ONSET: When did this begin? Did it start suddenly or has this been gradual?     A couple months ago- EYE MD 3 weeks ago- dry eyes- didn't help 5. PATTERN: Does this come and go, or has it  been constant since it started?     Worsens at times but never resolves- 6. PAIN: Is there any pain in your eye(s)?  (Scale 1-10; or mild, moderate, severe)     Denies pain  7. CONTACTS-GLASSES: Do you wear contacts or glasses?     Glasses  8. CAUSE: What do you think is causing this visual problem?     PreDiabetic range for A1C checked through blood donation. Didn't give a specific.  9. OTHER SYMPTOMS: Do you have any other symptoms? (e.g., confusion, headache, arm or leg weakness, speech problems)     Denies any other symptoms  Protocols used: Vision Loss or Change-A-AH

## 2024-03-21 ENCOUNTER — Ambulatory Visit: Admitting: Family Medicine

## 2024-03-21 VITALS — BP 137/85 | HR 79 | Temp 97.9°F | Ht 68.0 in | Wt 180.2 lb

## 2024-03-21 DIAGNOSIS — Z131 Encounter for screening for diabetes mellitus: Secondary | ICD-10-CM | POA: Diagnosis not present

## 2024-03-21 DIAGNOSIS — Z23 Encounter for immunization: Secondary | ICD-10-CM

## 2024-03-21 DIAGNOSIS — R7303 Prediabetes: Secondary | ICD-10-CM

## 2024-03-21 LAB — POCT GLUCOSE (DEVICE FOR HOME USE): POC Glucose: 106 mg/dL — AB (ref 70–99)

## 2024-03-21 LAB — POCT GLYCOSYLATED HEMOGLOBIN (HGB A1C): Hemoglobin A1C: 5.8 % — AB (ref 4.0–5.6)

## 2024-03-22 DIAGNOSIS — R7303 Prediabetes: Secondary | ICD-10-CM | POA: Insufficient documentation

## 2024-03-22 DIAGNOSIS — Z23 Encounter for immunization: Secondary | ICD-10-CM | POA: Insufficient documentation

## 2024-03-22 NOTE — Assessment & Plan Note (Signed)
 Reassurance that her A1c is 5.8%.  She can control this with her diet with ease

## 2024-03-22 NOTE — Progress Notes (Signed)
 Established Patient Office Visit  Subjective   Patient ID: Jordan Salazar, female    DOB: 1945/04/06  Age: 78 y.o. MRN: [id number]  No chief complaint on file.   HPI Discussed the use of AI scribe software for clinical note transcription with the patient, who gave verbal consent to proceed.  History of Present Illness   Jordan Salazar is a 78 year old female who presents with concerns about her A1c levels and vision changes.  She received a notification from the [employer] indicating that her A1c levels were in the prediabetic range. She has a history of her A1c being in the prediabetic range for years. She donates blood four to five times a year, and the [employer] has been periodically checking her A1c levels. Her A1c today is 5.8%.  She describes her vision changes as feeling like she has 'dirty glasses on' rather than blurred vision. She visited her optometrist three weeks ago, who told her it looked like dry eyes to him. She has been using eye drops since then, which have provided some improvement, though she still experiences some issues.  She uses Voltaren gel for knee pain, applying it twice a day, which she finds helpful. She currently uses it on both knees. She also inquires about using it on her wrist, where she has a ganglion cyst, and mentions using EMU cream and wearing a brace at night for the ganglion cyst.  Her current medications include Tylenol , Tums, garlic, magnesium , turmeric, omega-3 fatty acids, and a small dose of Altace  (2.5 mg) for blood pressure. She has stopped using Celebrex. She is up to date with her vaccinations, having received flu and COVID vaccines this fall. She also received an RSV vaccine two years ago and is inquiring about the need for another dose. She is current with her pneumonia vaccines.       Objective:     BP 137/85   Pulse 79   Temp 97.9 F (36.6 C) (Oral)   Ht 5' 8 (1.727 m)   Wt 180 lb 4 oz (81.8 kg)   SpO2 98%   BMI 27.41  kg/m    Physical Exam Vitals and nursing note reviewed.  Constitutional:      Appearance: Normal appearance.  HENT:     Head: Normocephalic and atraumatic.  Eyes:     Conjunctiva/sclera: Conjunctivae normal.  Cardiovascular:     Rate and Rhythm: Normal rate and regular rhythm.  Pulmonary:     Effort: Pulmonary effort is normal.     Breath sounds: Normal breath sounds.  Musculoskeletal:     Right lower leg: No edema.     Left lower leg: No edema.     Comments: Ganglion cyst radial aspect left wrist anteriorly  Skin:    General: Skin is warm and dry.  Neurological:     Mental Status: She is alert and oriented to person, place, and time.  Psychiatric:        Mood and Affect: Mood normal.        Behavior: Behavior normal.        Thought Content: Thought content normal.        Judgment: Judgment normal.          Results for orders placed or performed in visit on 03/21/24  POCT HgB A1C  Result Value Ref Range   Hemoglobin A1C 5.8 (A) 4.0 - 5.6 %   HbA1c POC (<> result, manual entry)     HbA1c,  POC (prediabetic range)     HbA1c, POC (controlled diabetic range)    POCT Glucose (Device for Home Use)  Result Value Ref Range   Glucose Fasting, POC     POC Glucose 106 (A) 70 - 99 mg/dl      The ASCVD Risk score (Arnett DK, et al., 2019) failed to calculate for the following reasons:   Cannot find a previous HDL lab   Cannot find a previous total cholesterol lab   * - Cholesterol units were assumed    Assessment & Plan:  Screening for diabetes mellitus (DM) -     POCT glycosylated hemoglobin (Hb A1C) -     POCT Glucose (Device for Home Use)  Borderline diabetes Assessment & Plan: Reassurance that her A1c is 5.8%.  She can control this with her diet with ease   Immunization due Assessment & Plan: RSV is annually so she is due for that now.  She has already gotten her flu and COVID vaccines for this year.      Return in about 3 months (around 06/19/2024).     Brucha Ahlquist K Latika Kronick, MD

## 2024-03-22 NOTE — Assessment & Plan Note (Signed)
 RSV is annually so she is due for that now.  She has already gotten her flu and COVID vaccines for this year.

## 2024-04-19 ENCOUNTER — Ambulatory Visit: Admitting: Family Medicine

## 2024-04-19 VITALS — BP 165/82 | HR 79 | Temp 98.1°F | Resp 16 | Wt 177.2 lb

## 2024-04-19 DIAGNOSIS — G8929 Other chronic pain: Secondary | ICD-10-CM

## 2024-04-19 DIAGNOSIS — I10 Essential (primary) hypertension: Secondary | ICD-10-CM

## 2024-04-19 DIAGNOSIS — M25562 Pain in left knee: Secondary | ICD-10-CM | POA: Diagnosis not present

## 2024-04-19 DIAGNOSIS — Z0000 Encounter for general adult medical examination without abnormal findings: Secondary | ICD-10-CM

## 2024-04-19 DIAGNOSIS — M25561 Pain in right knee: Secondary | ICD-10-CM

## 2024-04-19 MED ORDER — GABAPENTIN 300 MG PO CAPS: 300.0000 mg | ORAL_CAPSULE | Freq: Every day | ORAL | 3 refills | Status: AC

## 2024-04-19 MED ORDER — RAMIPRIL 2.5 MG PO CAPS: 2.5000 mg | ORAL_CAPSULE | Freq: Every day | ORAL | 1 refills | Status: DC

## 2024-04-19 MED ORDER — GABAPENTIN 300 MG PO CAPS: 300.0000 mg | ORAL_CAPSULE | Freq: Every day | ORAL | 1 refills | Status: DC

## 2024-04-19 MED ORDER — RAMIPRIL 5 MG PO CAPS: 5.0000 mg | ORAL_CAPSULE | Freq: Every day | ORAL | 3 refills | Status: AC

## 2024-04-19 NOTE — Assessment & Plan Note (Signed)
 Please increase the ramipril  to 5mg  daily.  Check your BP at home please

## 2024-04-19 NOTE — Progress Notes (Addendum)
 "  Complete physical exam  Patient: Jordan Salazar   DOB: 1946-03-18   79 y.o. Female  MRN: [id number]  Subjective:    Chief Complaint  Patient presents with   physical    Pt. Here for a physical. Pt. Denies pain at this time.     Jordan Salazar is a 79 y.o. female who presents today for a complete physical exam. She reports consuming a low fat diet. She exercises by walking 1.5 to 2 miles per day.   She generally feels well. She reports sleeping well. She does not have additional problems to discuss today.     Most recent fall risk assessment:    04/19/2024    8:11 AM  Fall Risk   Falls in the past year? 0  Number falls in past yr: 0  Injury with Fall? 0  Risk for fall due to : No Fall Risks  Follow up Falls evaluation completed     Most recent depression screenings:    04/19/2024    8:11 AM 11/08/2023    8:53 AM  PHQ 2/9 Scores  PHQ - 2 Score 0 0  PHQ- 9 Score 1 1      Data saved with a previous flowsheet row definition        Patient Care Team: Kalie Cabral K, MD as PCP - General (Family Medicine) Corlis Honor BROCKS, MD (Inactive) (Internal Medicine) Dellie Louanne MATSU, MD (General Surgery)   Show/hide medication list[1]       Objective:    BP (!) 165/82   Pulse 79   Temp 98.1 F (36.7 C) (Oral)   Resp 16   Wt 177 lb 3.2 oz (80.4 kg)   SpO2 96%   BMI 26.94 kg/m    Physical Exam Vitals and nursing note reviewed. Exam conducted with a chaperone present.  Constitutional:      Appearance: Normal appearance.  HENT:     Head: Normocephalic and atraumatic.     Right Ear: Hearing, tympanic membrane, ear canal and external ear normal.     Left Ear: Hearing, tympanic membrane, ear canal and external ear normal.  Eyes:     Conjunctiva/sclera: Conjunctivae normal.  Neck:     Thyroid: No thyroid mass or thyromegaly.  Cardiovascular:     Rate and Rhythm: Normal rate and regular rhythm.  Pulmonary:     Effort: Pulmonary effort is normal.      Breath sounds: Normal breath sounds.  Chest:     Chest wall: No mass.  Breasts:    Right: Normal. No mass, nipple discharge or skin change.     Left: Normal. No mass, nipple discharge or skin change.  Abdominal:     General: Abdomen is flat. Bowel sounds are normal.     Palpations: Abdomen is soft.     Tenderness: There is no abdominal tenderness.  Musculoskeletal:     Cervical back: Full passive range of motion without pain.     Right lower leg: No edema.     Left lower leg: No edema.  Lymphadenopathy:     Upper Body:     Right upper body: No supraclavicular, axillary or pectoral adenopathy.     Left upper body: No supraclavicular, axillary or pectoral adenopathy.  Skin:    General: Skin is warm and dry.  Neurological:     Mental Status: She is alert and oriented to person, place, and time.  Psychiatric:        Mood and Affect:  Mood normal.        Behavior: Behavior normal.        Thought Content: Thought content normal.        Judgment: Judgment normal.      No results found for any visits on 04/19/24.   The ASCVD Risk score (Arnett DK, et al., 2019) failed to calculate for the following reasons:   Cannot find a previous HDL lab   Cannot find a previous total cholesterol lab   * - Cholesterol units were assumed     Assessment & Plan:    Routine Health Maintenance and Physical Exam  Health Maintenance  Topic Date Due   Medicare Annual Wellness Visit  Never done   DTaP/Tdap/Td vaccine (1 - Tdap) Never done   Pneumococcal Vaccine for age over 46 (1 of 2 - PCV) Never done   Zoster (Shingles) Vaccine (1 of 2) Never done   COVID-19 Vaccine (3 - Pfizer risk series) 06/28/2019   Flu Shot  11/04/2023   Osteoporosis screening with Bone Density Scan  Completed   Hepatitis C Screening  Completed   Meningitis B Vaccine  Aged Out   Breast Cancer Screening  Discontinued   Colon Cancer Screening  Discontinued    Discussed health benefits of physical activity, and  encouraged her to engage in regular exercise appropriate for her age and condition.  Wellness examination -     CBC with Differential/Platelet -     Comprehensive metabolic panel with GFR -     Lipid panel -     TSH -     T4, free  Primary hypertension Assessment & Plan: Please increase the ramipril  to 5mg  daily.  Check your BP at home please  Orders: -     Ramipril ; Take 1 capsule (5 mg total) by mouth daily.  Dispense: 90 capsule; Refill: 3  Chronic pain of both knees -     Gabapentin ; Take 1 capsule (300 mg total) by mouth at bedtime.  Dispense: 90 capsule; Refill: 3    Return in about 6 months (around 10/17/2024).     Raizy Auzenne K Clinton Wahlberg, MD     [1]  Outpatient Medications Prior to Visit  Medication Sig   acetaminophen  (TYLENOL ) 500 MG tablet Take 1,000 mg by mouth every 6 (six) hours as needed for mild pain.   calcium  carbonate (TUMS - DOSED IN MG ELEMENTAL CALCIUM ) 500 MG chewable tablet Chew 2 tablets by mouth daily.   CINNAMON PO Take 2,000 mg by mouth daily.   cyanocobalamin  (VITAMIN B12) 1000 MCG tablet Take 1,000 mcg by mouth daily.   Garlic 1000 MG CAPS Take 1 capsule by mouth daily.   Magnesium  500 MG TABS Take 1 tablet by mouth daily.   Omega-3 Fatty Acids (FISH OIL) 1200 MG CPDR Take 2 capsules by mouth daily.    TURMERIC PO Take 1,500 mg by mouth daily.   zinc gluconate 50 MG tablet Take 50 mg by mouth daily.   [DISCONTINUED] gabapentin  (NEURONTIN ) 300 MG capsule TAKE ONE CAPSULE BY MOUTH EVERY DAY AT BEDTIME   [DISCONTINUED] ramipril  (ALTACE ) 2.5 MG capsule Take 2.5 mg by mouth daily.   CANNABIDIOL PO Take 25 mg by mouth daily.   diclofenac Sodium (VOLTAREN) 1 % GEL Apply 2 g topically in the morning and at bedtime. (Patient not taking: Reported on 04/19/2024)   No facility-administered medications prior to visit.   "

## 2024-04-20 LAB — LIPID PANEL
Chol/HDL Ratio: 2.1 ratio (ref 0.0–4.4)
Cholesterol, Total: 166 mg/dL (ref 100–199)
HDL: 79 mg/dL
LDL Chol Calc (NIH): 65 mg/dL (ref 0–99)
Triglycerides: 132 mg/dL (ref 0–149)
VLDL Cholesterol Cal: 22 mg/dL (ref 5–40)

## 2024-04-20 LAB — CBC WITH DIFFERENTIAL/PLATELET
Basophils Absolute: 0 x10E3/uL (ref 0.0–0.2)
Basos: 1 %
EOS (ABSOLUTE): 0.1 x10E3/uL (ref 0.0–0.4)
Eos: 2 %
Hematocrit: 45 % (ref 34.0–46.6)
Hemoglobin: 14 g/dL (ref 11.1–15.9)
Immature Grans (Abs): 0 x10E3/uL (ref 0.0–0.1)
Immature Granulocytes: 0 %
Lymphocytes Absolute: 0.8 x10E3/uL (ref 0.7–3.1)
Lymphs: 25 %
MCH: 26.6 pg (ref 26.6–33.0)
MCHC: 31.1 g/dL — ABNORMAL LOW (ref 31.5–35.7)
MCV: 85 fL (ref 79–97)
Monocytes Absolute: 0.4 x10E3/uL (ref 0.1–0.9)
Monocytes: 13 %
Neutrophils Absolute: 2 x10E3/uL (ref 1.4–7.0)
Neutrophils: 59 %
Platelets: 337 x10E3/uL (ref 150–450)
RBC: 5.27 x10E6/uL (ref 3.77–5.28)
RDW: 16 % — ABNORMAL HIGH (ref 11.7–15.4)
WBC: 3.4 x10E3/uL (ref 3.4–10.8)

## 2024-04-20 LAB — COMPREHENSIVE METABOLIC PANEL WITH GFR
ALT: 12 IU/L (ref 0–32)
AST: 16 IU/L (ref 0–40)
Albumin: 4.5 g/dL (ref 3.8–4.8)
Alkaline Phosphatase: 76 IU/L (ref 49–135)
BUN/Creatinine Ratio: 17 (ref 12–28)
BUN: 13 mg/dL (ref 8–27)
Bilirubin Total: 0.5 mg/dL (ref 0.0–1.2)
CO2: 25 mmol/L (ref 20–29)
Calcium: 9.8 mg/dL (ref 8.7–10.3)
Chloride: 104 mmol/L (ref 96–106)
Creatinine, Ser: 0.77 mg/dL (ref 0.57–1.00)
Globulin, Total: 2.6 g/dL (ref 1.5–4.5)
Glucose: 101 mg/dL — ABNORMAL HIGH (ref 70–99)
Potassium: 4.7 mmol/L (ref 3.5–5.2)
Sodium: 142 mmol/L (ref 134–144)
Total Protein: 7.1 g/dL (ref 6.0–8.5)
eGFR: 79 mL/min/1.73

## 2024-04-20 LAB — TSH: TSH: 1.64 u[IU]/mL (ref 0.450–4.500)

## 2024-04-24 ENCOUNTER — Ambulatory Visit: Payer: Self-pay | Admitting: Family Medicine

## 2024-05-10 ENCOUNTER — Ambulatory Visit: Admitting: Family Medicine

## 2024-10-15 ENCOUNTER — Ambulatory Visit: Admitting: Family Medicine
